# Patient Record
Sex: Female | Born: 1990 | Race: White | Hispanic: No | Marital: Married | Smoking: Never smoker
Health system: Southern US, Community
[De-identification: ages and names within clinical notes are randomized; demographics above are authoritative.]

## PROBLEM LIST (undated history)

## (undated) ENCOUNTER — Inpatient Hospital Stay (HOSPITAL_COMMUNITY): Payer: Self-pay

## (undated) DIAGNOSIS — E039 Hypothyroidism, unspecified: Secondary | ICD-10-CM

## (undated) DIAGNOSIS — K900 Celiac disease: Secondary | ICD-10-CM

## (undated) DIAGNOSIS — E109 Type 1 diabetes mellitus without complications: Secondary | ICD-10-CM

## (undated) DIAGNOSIS — Z8759 Personal history of other complications of pregnancy, childbirth and the puerperium: Secondary | ICD-10-CM

## (undated) DIAGNOSIS — Z9641 Presence of insulin pump (external) (internal): Secondary | ICD-10-CM

## (undated) DIAGNOSIS — Z973 Presence of spectacles and contact lenses: Secondary | ICD-10-CM

## (undated) HISTORY — DX: Celiac disease: K90.0

## (undated) HISTORY — DX: Hypothyroidism, unspecified: E03.9

---
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
## 2009-03-28 ENCOUNTER — Ambulatory Visit (HOSPITAL_COMMUNITY): Admission: RE | Admit: 2009-03-28 | Discharge: 2009-03-28 | Payer: Self-pay | Admitting: Obstetrics and Gynecology

## 2009-05-30 ENCOUNTER — Ambulatory Visit: Payer: Self-pay | Admitting: Obstetrics and Gynecology

## 2009-05-30 ENCOUNTER — Inpatient Hospital Stay (HOSPITAL_COMMUNITY): Admission: AD | Admit: 2009-05-30 | Discharge: 2009-05-30 | Payer: Self-pay | Admitting: Obstetrics and Gynecology

## 2009-06-03 ENCOUNTER — Inpatient Hospital Stay (HOSPITAL_COMMUNITY): Admission: AD | Admit: 2009-06-03 | Discharge: 2009-06-03 | Payer: Self-pay | Admitting: Obstetrics and Gynecology

## 2009-06-03 ENCOUNTER — Ambulatory Visit: Payer: Self-pay | Admitting: Advanced Practice Midwife

## 2009-06-17 ENCOUNTER — Inpatient Hospital Stay (HOSPITAL_COMMUNITY): Admission: AD | Admit: 2009-06-17 | Discharge: 2009-06-20 | Payer: Self-pay | Admitting: Obstetrics and Gynecology

## 2009-06-18 ENCOUNTER — Encounter (INDEPENDENT_AMBULATORY_CARE_PROVIDER_SITE_OTHER): Payer: Self-pay | Admitting: Obstetrics and Gynecology

## 2010-02-27 IMAGING — CT CT HEAD W/O CM
2 series · 16 of 30 positions shown, 20 images · non-contrast
Comparison: None.

CLINICAL DATA: Sudden onset of confusion.  Headache.  Third
trimester pregnancy.

CT HEAD WITHOUT CONTRAST
TECHNIQUE: Contiguous axial images were obtained from the base of
the skull through the vertex without intravenous contrast. The
patient's abdomen was shielded during imaging.

[Series 2: routine head w/o · axial · non-contrast · 0.39mm/px · z∈[+1002,+1127]mm · 13 of 32 slices shown, 17 images]
[im 3/32  brain]
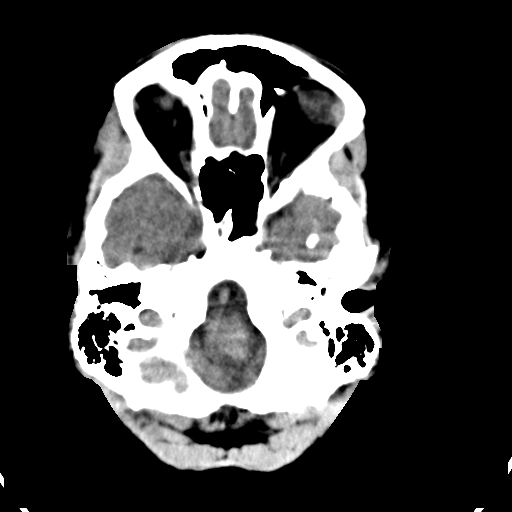
[im 3/32  bone]
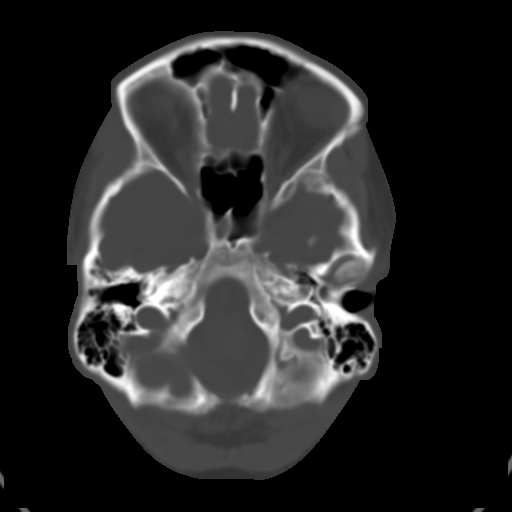
[im 5/32  brain]
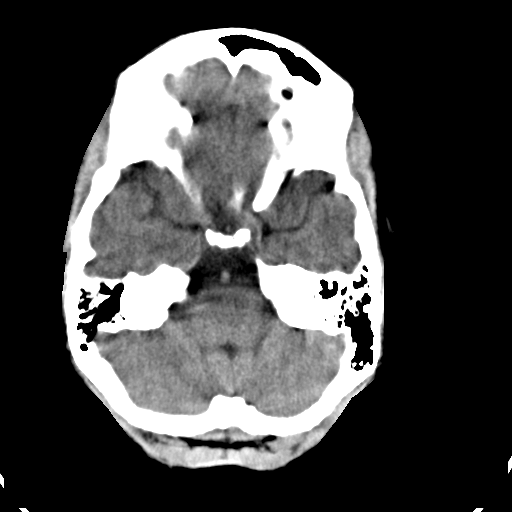
[im 7/32  brain]
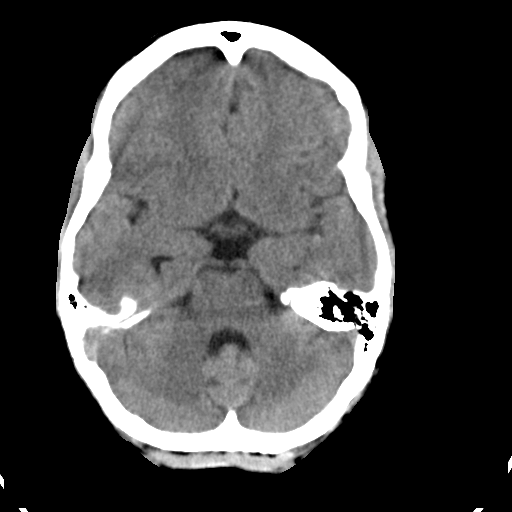
[im 9/32  brain]
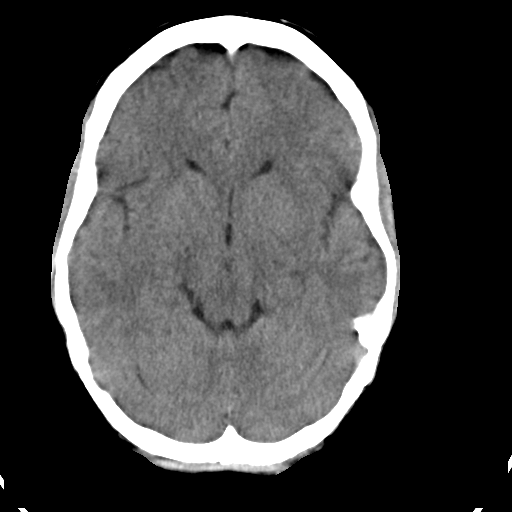
[im 12/32  brain]
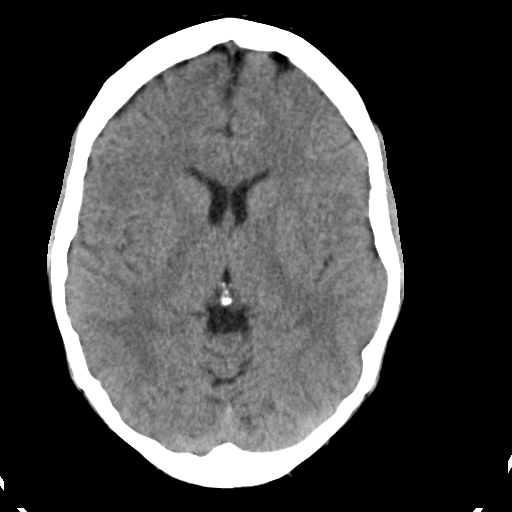
[im 12/32  bone]
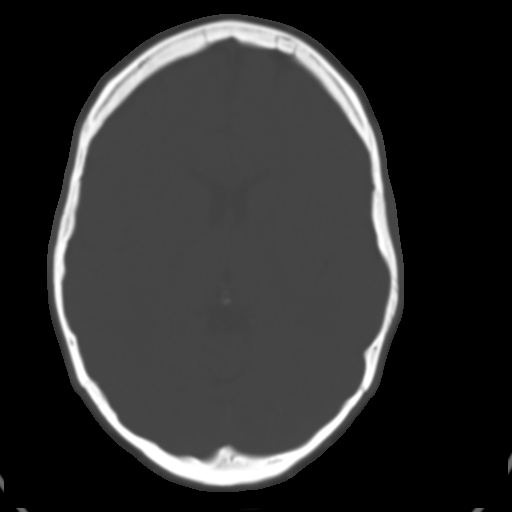
[im 14/32  brain]
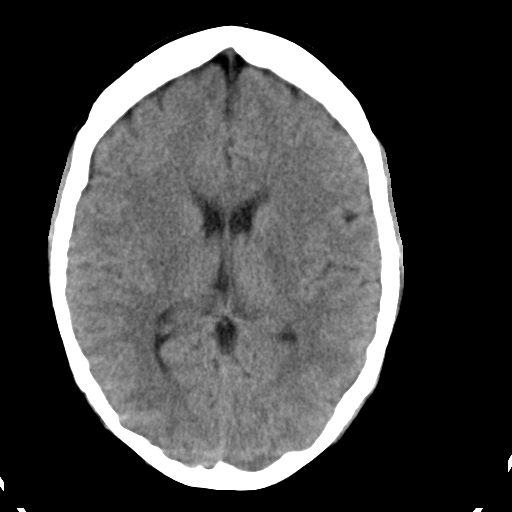
[im 16/32  brain]
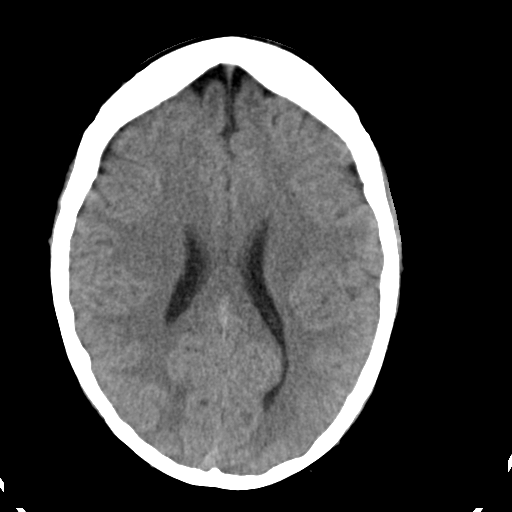
[im 18/32  brain]
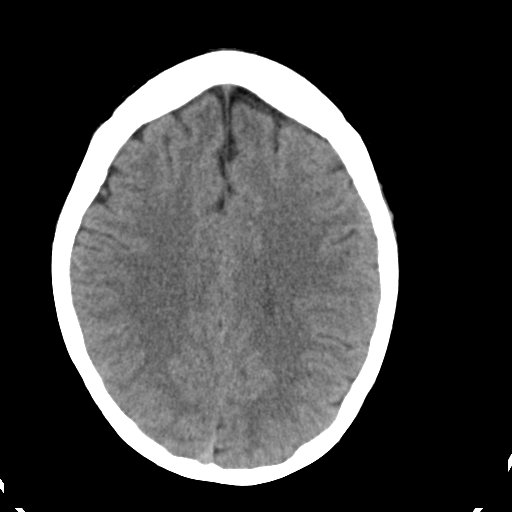
[im 20/32  brain]
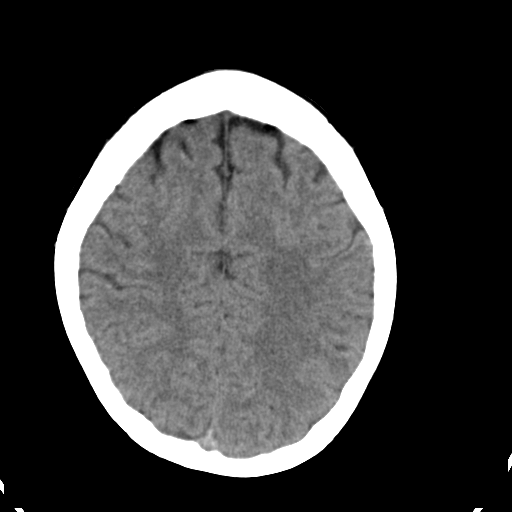
[im 20/32  bone]
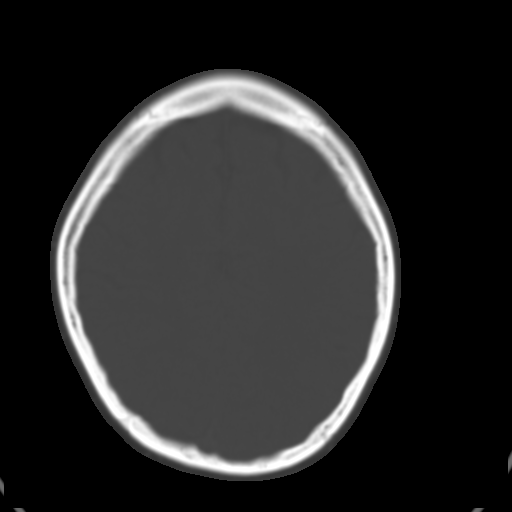
[im 23/32  brain]
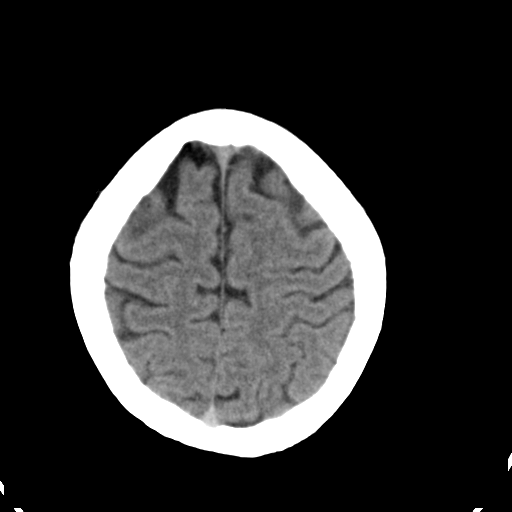
[im 25/32  brain]
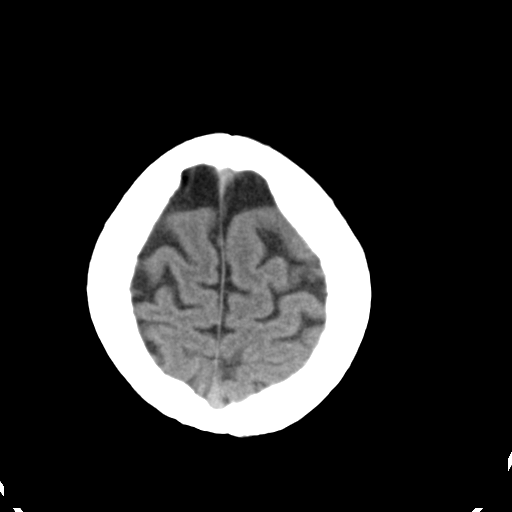
[im 27/32  brain]
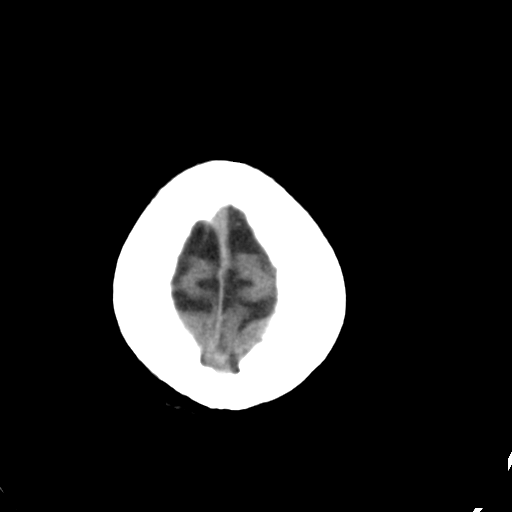
[im 29/32  brain]
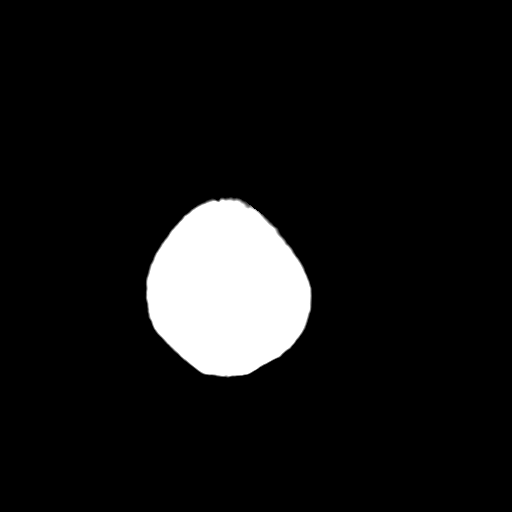
[im 29/32  bone]
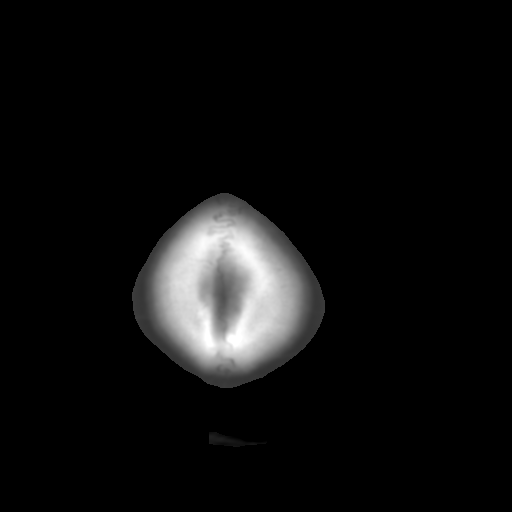

[Series 3: routine head bone · axial · 0.39mm/px · z∈[+1002,+1046]mm · 3 of 32 slices shown]
[im 3/32  bone]
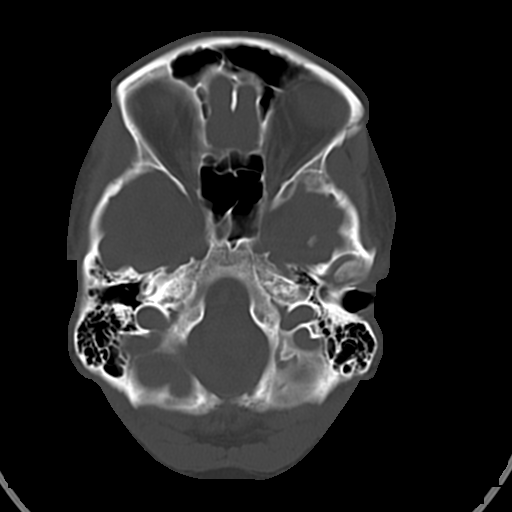
[im 7/32  bone]
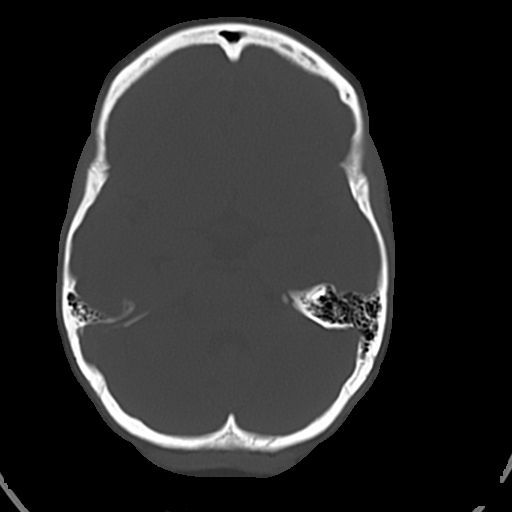
[im 12/32  bone]
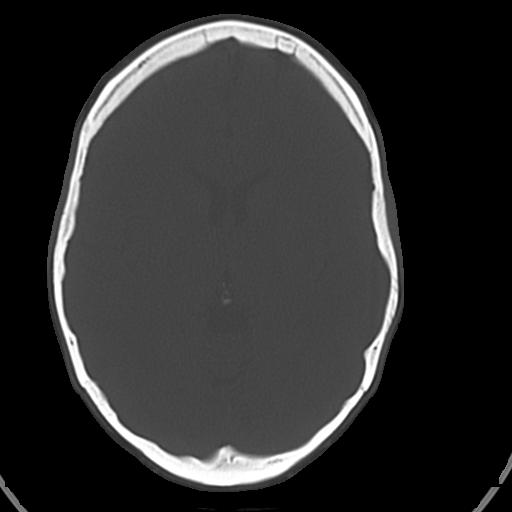

[16 of 30 positions shown; findings below may reference images not displayed]

FINDINGS: The brain stem, cerebellum, cerebral peduncles, thalami,
basal ganglia, basilar cisterns, and ventricular system appear
unremarkable.

No intracranial hemorrhage, mass lesion, or acute infarction is
identified.
IMPRESSION: 1.  No acute intracranial findings are identified.

## 2010-09-21 LAB — CBC
HCT: 26.8 % — ABNORMAL LOW (ref 36.0–46.0)
HCT: 38.8 % (ref 36.0–46.0)
Hemoglobin: 10.9 g/dL — ABNORMAL LOW (ref 12.0–15.0)
Hemoglobin: 8.2 g/dL — ABNORMAL LOW (ref 12.0–15.0)
Hemoglobin: 8.9 g/dL — ABNORMAL LOW (ref 12.0–15.0)
MCHC: 33.2 g/dL (ref 30.0–36.0)
MCHC: 34 g/dL (ref 30.0–36.0)
MCHC: 34.1 g/dL (ref 30.0–36.0)
MCV: 94.7 fL (ref 78.0–100.0)
MCV: 95.6 fL (ref 78.0–100.0)
Platelets: 156 10*3/uL (ref 150–400)
Platelets: 195 10*3/uL (ref 150–400)
RBC: 2.52 MIL/uL — ABNORMAL LOW (ref 3.87–5.11)
RBC: 3.39 MIL/uL — ABNORMAL LOW (ref 3.87–5.11)
RDW: 12.1 % (ref 11.5–15.5)
RDW: 12.1 % (ref 11.5–15.5)
RDW: 12.5 % (ref 11.5–15.5)
WBC: 23.2 10*3/uL — ABNORMAL HIGH (ref 4.0–10.5)

## 2010-09-21 LAB — GLUCOSE, CAPILLARY
Glucose-Capillary: 106 mg/dL — ABNORMAL HIGH (ref 70–99)
Glucose-Capillary: 110 mg/dL — ABNORMAL HIGH (ref 70–99)
Glucose-Capillary: 114 mg/dL — ABNORMAL HIGH (ref 70–99)
Glucose-Capillary: 115 mg/dL — ABNORMAL HIGH (ref 70–99)
Glucose-Capillary: 116 mg/dL — ABNORMAL HIGH (ref 70–99)
Glucose-Capillary: 117 mg/dL — ABNORMAL HIGH (ref 70–99)
Glucose-Capillary: 125 mg/dL — ABNORMAL HIGH (ref 70–99)
Glucose-Capillary: 129 mg/dL — ABNORMAL HIGH (ref 70–99)
Glucose-Capillary: 133 mg/dL — ABNORMAL HIGH (ref 70–99)
Glucose-Capillary: 138 mg/dL — ABNORMAL HIGH (ref 70–99)
Glucose-Capillary: 141 mg/dL — ABNORMAL HIGH (ref 70–99)
Glucose-Capillary: 142 mg/dL — ABNORMAL HIGH (ref 70–99)
Glucose-Capillary: 143 mg/dL — ABNORMAL HIGH (ref 70–99)
Glucose-Capillary: 165 mg/dL — ABNORMAL HIGH (ref 70–99)
Glucose-Capillary: 193 mg/dL — ABNORMAL HIGH (ref 70–99)
Glucose-Capillary: 93 mg/dL (ref 70–99)
Glucose-Capillary: 94 mg/dL (ref 70–99)
Glucose-Capillary: 97 mg/dL (ref 70–99)
Glucose-Capillary: 99 mg/dL (ref 70–99)

## 2010-09-21 LAB — COMPREHENSIVE METABOLIC PANEL
ALT: 16 U/L (ref 0–35)
AST: 22 U/L (ref 0–37)
Albumin: 1.7 g/dL — ABNORMAL LOW (ref 3.5–5.2)
Albumin: 2.6 g/dL — ABNORMAL LOW (ref 3.5–5.2)
Alkaline Phosphatase: 83 U/L (ref 39–117)
BUN: 5 mg/dL — ABNORMAL LOW (ref 6–23)
CO2: 28 mEq/L (ref 19–32)
Calcium: 5.6 mg/dL — CL (ref 8.4–10.5)
Chloride: 104 mEq/L (ref 96–112)
Creatinine, Ser: 0.57 mg/dL (ref 0.4–1.2)
Creatinine, Ser: 0.63 mg/dL (ref 0.4–1.2)
Creatinine, Ser: 0.66 mg/dL (ref 0.4–1.2)
GFR calc Af Amer: >60 mL/min (ref >60)
GFR calc non Af Amer: >60 mL/min (ref >60)
Glucose, Bld: 144 mg/dL — ABNORMAL HIGH (ref 70–99)
Glucose, Bld: 64 mg/dL — ABNORMAL LOW (ref 70–99)
Sodium: 133 mEq/L — ABNORMAL LOW (ref 135–145)
Sodium: 135 mEq/L (ref 135–145)
Total Bilirubin: 0.4 mg/dL (ref 0.3–1.2)
Total Protein: 3.9 g/dL — ABNORMAL LOW (ref 6.0–8.3)
Total Protein: 4 g/dL — ABNORMAL LOW (ref 6.0–8.3)

## 2010-09-21 LAB — URIC ACID
Uric Acid, Serum: 5.7 mg/dL (ref 2.4–7.0)
Uric Acid, Serum: 6.1 mg/dL (ref 2.4–7.0)

## 2010-09-21 LAB — RPR: RPR Ser Ql: NONREACTIVE

## 2010-09-21 LAB — URINALYSIS, DIPSTICK ONLY
Bilirubin Urine: NEGATIVE
Leukocytes, UA: NEGATIVE
Nitrite: NEGATIVE
Specific Gravity, Urine: <1.005 — ABNORMAL LOW (ref 1.005–1.030)
pH: 6.5 (ref 5.0–8.0)

## 2010-09-21 LAB — LACTATE DEHYDROGENASE
LDH: 270 U/L — ABNORMAL HIGH (ref 94–250)
LDH: 277 U/L — ABNORMAL HIGH (ref 94–250)

## 2010-09-22 LAB — GLUCOSE, CAPILLARY: Glucose-Capillary: 155 mg/dL — ABNORMAL HIGH (ref 70–99)

## 2010-09-22 LAB — URINALYSIS, ROUTINE W REFLEX MICROSCOPIC
Nitrite: NEGATIVE
Protein, ur: NEGATIVE mg/dL
Specific Gravity, Urine: <1.005 — ABNORMAL LOW (ref 1.005–1.030)
Urobilinogen, UA: 0.2 mg/dL (ref 0.0–1.0)

## 2010-09-22 LAB — COMPREHENSIVE METABOLIC PANEL
AST: 25 U/L (ref 0–37)
CO2: 22 mEq/L (ref 19–32)
Calcium: 9 mg/dL (ref 8.4–10.5)
Creatinine, Ser: 0.53 mg/dL (ref 0.4–1.2)
GFR calc Af Amer: >60 mL/min (ref >60)
GFR calc non Af Amer: >60 mL/min (ref >60)
Sodium: 134 mEq/L — ABNORMAL LOW (ref 135–145)
Total Protein: 6.3 g/dL (ref 6.0–8.3)

## 2010-09-22 LAB — CBC
MCHC: 33.5 g/dL (ref 30.0–36.0)
MCV: 96.6 fL (ref 78.0–100.0)
Platelets: 234 10*3/uL (ref 150–400)
RBC: 4.33 MIL/uL (ref 3.87–5.11)
RDW: 12.3 % (ref 11.5–15.5)

## 2010-09-22 LAB — URINE MICROSCOPIC-ADD ON

## 2010-12-04 LAB — CBC: Hemoglobin: 10.9 g/dL — AB (ref 12.0–16.0)

## 2010-12-04 LAB — ABO/RH: RH Type: POSITIVE

## 2010-12-04 LAB — CULTURE, BETA STREP (GROUP B ONLY)
Organism ID, Bacteria: POSITIVE
Organism ID, Bacteria: POSITIVE

## 2010-12-04 LAB — HEPATITIS B SURFACE ANTIGEN: Hepatitis B Surface Ag: NEGATIVE

## 2010-12-04 LAB — STREP B DNA PROBE: GBS: POSITIVE

## 2010-12-04 LAB — RPR: RPR: NONREACTIVE

## 2011-01-11 LAB — STREP B DNA PROBE
GBS: POSITIVE
GBS: POSITIVE

## 2011-06-22 NOTE — L&D Delivery Note (Signed)
Delivery Note At  a healthy female was delivered via  OA Presentation APGAR: 9 9 weight . pending Placenta status:normal intact with 3 vessel cord Cord:  with the following complications: none  Anesthesia: Epidural  Episiotomy: none Lacerations: none Suture Repair: not applicable Est. Blood Loss (mL): 300  Mom to postpartum.  Baby to nursery-stable.  Griffin Gerrard L 07/02/2011, 1:27 PM

## 2011-06-28 ENCOUNTER — Encounter (HOSPITAL_COMMUNITY): Payer: Self-pay | Admitting: *Deleted

## 2011-06-28 ENCOUNTER — Telehealth (HOSPITAL_COMMUNITY): Payer: Self-pay | Admitting: *Deleted

## 2011-06-28 NOTE — Telephone Encounter (Signed)
Preadmission screen  

## 2011-07-01 ENCOUNTER — Encounter (HOSPITAL_COMMUNITY): Payer: Medicaid Other

## 2011-07-01 ENCOUNTER — Encounter (HOSPITAL_COMMUNITY): Payer: Self-pay

## 2011-07-01 ENCOUNTER — Inpatient Hospital Stay (HOSPITAL_COMMUNITY)
Admission: RE | Admit: 2011-07-01 | Discharge: 2011-07-04 | DRG: 372 | Disposition: A | Discharge status: Home / Self Care | Payer: BC Managed Care – PPO | Source: Ambulatory Visit | Attending: Obstetrics and Gynecology | Admitting: Obstetrics and Gynecology

## 2011-07-01 DIAGNOSIS — O99892 Other specified diseases and conditions complicating childbirth: Secondary | ICD-10-CM | POA: Diagnosis present

## 2011-07-01 DIAGNOSIS — O2432 Unspecified pre-existing diabetes mellitus in childbirth: Principal | ICD-10-CM | POA: Diagnosis present

## 2011-07-01 DIAGNOSIS — E119 Type 2 diabetes mellitus without complications: Secondary | ICD-10-CM | POA: Diagnosis present

## 2011-07-01 DIAGNOSIS — Z22330 Carrier of Group B streptococcus: Secondary | ICD-10-CM

## 2011-07-01 LAB — CBC
HCT: 33.2 % — ABNORMAL LOW (ref 36.0–46.0)
Hemoglobin: 10.8 g/dL — ABNORMAL LOW (ref 12.0–15.0)
MCV: 83.8 fL (ref 78.0–100.0)
RBC: 3.96 MIL/uL (ref 3.87–5.11)
RDW: 13.7 % (ref 11.5–15.5)
WBC: 9.4 10*3/uL (ref 4.0–10.5)

## 2011-07-01 MED ORDER — FLEET ENEMA 7-19 GM/118ML RE ENEM: 1.0000 | ENEMA | RECTAL | Status: DC | PRN

## 2011-07-01 MED ORDER — ACETAMINOPHEN 325 MG PO TABS: 650.0000 mg | ORAL_TABLET | ORAL | Status: DC | PRN

## 2011-07-01 MED ORDER — CITRIC ACID-SODIUM CITRATE 334-500 MG/5ML PO SOLN: 30.0000 mL | ORAL | Status: DC | PRN

## 2011-07-01 MED ORDER — SODIUM CHLORIDE 0.9 % IV SOLN
2.0000 g | Freq: Four times a day (QID) | INTRAVENOUS | Status: DC
  Administered 2011-07-01 – 2011-07-02 (×3): 2 g via INTRAVENOUS
  Filled 2011-07-01 (×6): qty 2000

## 2011-07-01 MED ORDER — OXYTOCIN BOLUS FROM INFUSION
500.0000 mL | Freq: Once | INTRAVENOUS | Status: AC
  Administered 2011-07-02: 500 mL via INTRAVENOUS
  Filled 2011-07-01: qty 500
  Filled 2011-07-01: qty 1000

## 2011-07-01 MED ORDER — LEVOTHYROXINE SODIUM 75 MCG PO TABS
75.0000 ug | ORAL_TABLET | Freq: Every day | ORAL | Status: DC
  Administered 2011-07-03 – 2011-07-04 (×2): 75 ug via ORAL
  Filled 2011-07-01 (×3): qty 1

## 2011-07-01 MED ORDER — OXYTOCIN 20 UNITS IN LACTATED RINGERS INFUSION - SIMPLE: 125.0000 mL/h | Freq: Once | INTRAVENOUS | Status: DC

## 2011-07-01 MED ORDER — BUTORPHANOL TARTRATE 2 MG/ML IJ SOLN: 1.0000 mg | INTRAMUSCULAR | Status: DC | PRN

## 2011-07-01 MED ORDER — TERBUTALINE SULFATE 1 MG/ML IJ SOLN: 0.2500 mg | Freq: Once | INTRAMUSCULAR | Status: AC | PRN

## 2011-07-01 MED ORDER — IBUPROFEN 600 MG PO TABS: 600.0000 mg | ORAL_TABLET | Freq: Four times a day (QID) | ORAL | Status: DC | PRN

## 2011-07-01 MED ORDER — LIDOCAINE HCL (PF) 1 % IJ SOLN
30.0000 mL | INTRAMUSCULAR | Status: DC | PRN
  Filled 2011-07-01: qty 30

## 2011-07-01 MED ORDER — LACTATED RINGERS IV SOLN
500.0000 mL | INTRAVENOUS | Status: DC | PRN
  Administered 2011-07-02: 500 mL via INTRAVENOUS
  Administered 2011-07-02: 300 mL via INTRAVENOUS

## 2011-07-01 MED ORDER — ONDANSETRON HCL 4 MG/2ML IJ SOLN: 4.0000 mg | Freq: Four times a day (QID) | INTRAMUSCULAR | Status: DC | PRN

## 2011-07-01 MED ORDER — OXYCODONE-ACETAMINOPHEN 5-325 MG PO TABS: 2.0000 | ORAL_TABLET | ORAL | Status: DC | PRN

## 2011-07-01 MED ORDER — INSULIN PUMP
1.0000 | Freq: Three times a day (TID) | SUBCUTANEOUS | Status: DC
  Administered 2011-07-01 – 2011-07-03 (×4): 1 via SUBCUTANEOUS
  Filled 2011-07-01: qty 1

## 2011-07-01 MED ORDER — MISOPROSTOL 25 MCG QUARTER TABLET
25.0000 ug | ORAL_TABLET | ORAL | Status: DC | PRN
  Administered 2011-07-01 – 2011-07-02 (×2): 25 ug via VAGINAL
  Filled 2011-07-01 (×2): qty 0.25

## 2011-07-01 MED ORDER — LACTATED RINGERS IV SOLN
INTRAVENOUS | Status: DC
  Administered 2011-07-01 – 2011-07-02 (×3): via INTRAVENOUS

## 2011-07-01 NOTE — Progress Notes (Signed)
Patient reports basal rates from her insulin pump. Rates and times are as followed: 0000  1.1units 0300 1.35 units 0700 1.3 units 1000 0.9 units 1200 1.0 units 1900 1.0 units

## 2011-07-02 ENCOUNTER — Encounter (HOSPITAL_COMMUNITY): Payer: Self-pay | Admitting: Anesthesiology

## 2011-07-02 ENCOUNTER — Encounter (HOSPITAL_COMMUNITY): Payer: Self-pay

## 2011-07-02 ENCOUNTER — Inpatient Hospital Stay (HOSPITAL_COMMUNITY): Payer: BC Managed Care – PPO | Admitting: Anesthesiology

## 2011-07-02 LAB — STREP B DNA PROBE: GBS: POSITIVE

## 2011-07-02 LAB — GLUCOSE, CAPILLARY
Glucose-Capillary: 105 mg/dL — ABNORMAL HIGH (ref 70–99)
Glucose-Capillary: 68 mg/dL — ABNORMAL LOW (ref 70–99)
Glucose-Capillary: 88 mg/dL (ref 70–99)

## 2011-07-02 MED ORDER — LIDOCAINE HCL 1.5 % IJ SOLN
INTRAMUSCULAR | Status: DC | PRN
  Administered 2011-07-02 (×2): 5 mL via EPIDURAL

## 2011-07-02 MED ORDER — ZOLPIDEM TARTRATE 5 MG PO TABS: 5.0000 mg | ORAL_TABLET | Freq: Every evening | ORAL | Status: DC | PRN

## 2011-07-02 MED ORDER — EPHEDRINE 5 MG/ML INJ: 10.0000 mg | INTRAVENOUS | Status: DC | PRN

## 2011-07-02 MED ORDER — BENZOCAINE-MENTHOL 20-0.5 % EX AERO
1.0000 "application " | INHALATION_SPRAY | CUTANEOUS | Status: DC | PRN
  Administered 2011-07-03: 1 via TOPICAL

## 2011-07-02 MED ORDER — BENZOCAINE-MENTHOL 20-0.5 % EX AERO
INHALATION_SPRAY | CUTANEOUS | Status: AC
  Administered 2011-07-03: 1 via TOPICAL
  Filled 2011-07-02: qty 56

## 2011-07-02 MED ORDER — WITCH HAZEL-GLYCERIN EX PADS: 1.0000 "application " | MEDICATED_PAD | CUTANEOUS | Status: DC | PRN

## 2011-07-02 MED ORDER — OXYCODONE-ACETAMINOPHEN 5-325 MG PO TABS: 1.0000 | ORAL_TABLET | ORAL | Status: DC | PRN

## 2011-07-02 MED ORDER — TETANUS-DIPHTH-ACELL PERTUSSIS 5-2.5-18.5 LF-MCG/0.5 IM SUSP: 0.5000 mL | Freq: Once | INTRAMUSCULAR | Status: DC

## 2011-07-02 MED ORDER — DIPHENHYDRAMINE HCL 25 MG PO CAPS: 25.0000 mg | ORAL_CAPSULE | Freq: Four times a day (QID) | ORAL | Status: DC | PRN

## 2011-07-02 MED ORDER — FENTANYL 2.5 MCG/ML BUPIVACAINE 1/10 % EPIDURAL INFUSION (WH - ANES)
14.0000 mL/h | INTRAMUSCULAR | Status: DC
  Administered 2011-07-02: 14 mL/h via EPIDURAL
  Filled 2011-07-02 (×2): qty 60

## 2011-07-02 MED ORDER — EPHEDRINE 5 MG/ML INJ
10.0000 mg | INTRAVENOUS | Status: DC | PRN
  Filled 2011-07-02: qty 4

## 2011-07-02 MED ORDER — BISACODYL 10 MG RE SUPP: 10.0000 mg | Freq: Every day | RECTAL | Status: DC | PRN

## 2011-07-02 MED ORDER — PHENYLEPHRINE 40 MCG/ML (10ML) SYRINGE FOR IV PUSH (FOR BLOOD PRESSURE SUPPORT)
80.0000 ug | PREFILLED_SYRINGE | INTRAVENOUS | Status: DC | PRN
  Filled 2011-07-02: qty 5

## 2011-07-02 MED ORDER — TERBUTALINE SULFATE 1 MG/ML IJ SOLN: 0.2500 mg | Freq: Once | INTRAMUSCULAR | Status: DC | PRN

## 2011-07-02 MED ORDER — SENNOSIDES-DOCUSATE SODIUM 8.6-50 MG PO TABS
2.0000 | ORAL_TABLET | Freq: Every day | ORAL | Status: DC
  Administered 2011-07-02 – 2011-07-03 (×2): 2 via ORAL

## 2011-07-02 MED ORDER — ONDANSETRON HCL 4 MG PO TABS: 4.0000 mg | ORAL_TABLET | ORAL | Status: DC | PRN

## 2011-07-02 MED ORDER — DIBUCAINE 1 % RE OINT: 1.0000 "application " | TOPICAL_OINTMENT | RECTAL | Status: DC | PRN

## 2011-07-02 MED ORDER — FLEET ENEMA 7-19 GM/118ML RE ENEM: 1.0000 | ENEMA | Freq: Every day | RECTAL | Status: DC | PRN

## 2011-07-02 MED ORDER — OXYTOCIN 20 UNITS IN LACTATED RINGERS INFUSION - SIMPLE
1.0000 m[IU]/min | INTRAVENOUS | Status: DC
  Administered 2011-07-02: 2 m[IU]/min via INTRAVENOUS

## 2011-07-02 MED ORDER — IBUPROFEN 600 MG PO TABS
600.0000 mg | ORAL_TABLET | Freq: Four times a day (QID) | ORAL | Status: DC
  Administered 2011-07-02 – 2011-07-04 (×7): 600 mg via ORAL
  Filled 2011-07-02 (×7): qty 1

## 2011-07-02 MED ORDER — FENTANYL 2.5 MCG/ML BUPIVACAINE 1/10 % EPIDURAL INFUSION (WH - ANES)
INTRAMUSCULAR | Status: DC | PRN
  Administered 2011-07-02: 14 mL/h via EPIDURAL

## 2011-07-02 MED ORDER — PHENYLEPHRINE 40 MCG/ML (10ML) SYRINGE FOR IV PUSH (FOR BLOOD PRESSURE SUPPORT): 80.0000 ug | PREFILLED_SYRINGE | INTRAVENOUS | Status: DC | PRN

## 2011-07-02 MED ORDER — PRENATAL MULTIVITAMIN CH
1.0000 | ORAL_TABLET | Freq: Every day | ORAL | Status: DC
  Administered 2011-07-02 – 2011-07-03 (×2): 1 via ORAL
  Filled 2011-07-02 (×2): qty 1

## 2011-07-02 MED ORDER — DIPHENHYDRAMINE HCL 50 MG/ML IJ SOLN: 12.5000 mg | INTRAMUSCULAR | Status: DC | PRN

## 2011-07-02 MED ORDER — MEDROXYPROGESTERONE ACETATE 150 MG/ML IM SUSP: 150.0000 mg | INTRAMUSCULAR | Status: DC | PRN

## 2011-07-02 MED ORDER — SIMETHICONE 80 MG PO CHEW: 80.0000 mg | CHEWABLE_TABLET | ORAL | Status: DC | PRN

## 2011-07-02 MED ORDER — MEASLES, MUMPS & RUBELLA VAC ~~LOC~~ INJ
0.5000 mL | INJECTION | Freq: Once | SUBCUTANEOUS | Status: AC
  Administered 2011-07-04: 0.5 mL via SUBCUTANEOUS
  Filled 2011-07-02: qty 0.5

## 2011-07-02 MED ORDER — LACTATED RINGERS IV SOLN: 500.0000 mL | Freq: Once | INTRAVENOUS | Status: DC

## 2011-07-02 MED ORDER — ONDANSETRON HCL 4 MG/2ML IJ SOLN: 4.0000 mg | INTRAMUSCULAR | Status: DC | PRN

## 2011-07-02 MED ORDER — LANOLIN HYDROUS EX OINT: TOPICAL_OINTMENT | CUTANEOUS | Status: DC | PRN

## 2011-07-02 NOTE — Anesthesia Preprocedure Evaluation (Signed)
Anesthesia Evaluation  Patient identified by MRN, date of birth, ID band Patient awake    Reviewed: Allergy & Precautions, H&P , Patient's Chart, lab work & pertinent test results  Airway Mallampati: I TM Distance: >3 FB Neck ROM: full    Dental No notable dental hx.    Pulmonary neg pulmonary ROS,    Pulmonary exam normal       Cardiovascular hypertension,     Neuro/Psych Negative Neurological ROS  Negative Psych ROS   GI/Hepatic negative GI ROS, Neg liver ROS,   Endo/Other  Diabetes mellitus-Hypothyroidism   Renal/GU negative Renal ROS  Genitourinary negative   Musculoskeletal negative musculoskeletal ROS (+)   Abdominal Normal abdominal exam  (+)   Peds negative pediatric ROS (+)  Hematology negative hematology ROS (+)   Anesthesia Other Findings   Reproductive/Obstetrics (+) Pregnancy                           Anesthesia Physical Anesthesia Plan  ASA: II  Anesthesia Plan: Epidural   Post-op Pain Management:    Induction:   Airway Management Planned:   Additional Equipment:   Intra-op Plan:   Post-operative Plan:   Informed Consent: I have reviewed the patients History and Physical, chart, labs and discussed the procedure including the risks, benefits and alternatives for the proposed anesthesia with the patient or authorized representative who has indicated his/her understanding and acceptance.     Plan Discussed with:   Anesthesia Plan Comments:         Anesthesia Quick Evaluation

## 2011-07-02 NOTE — H&P (Signed)
21 year old Gravida 2 Para 0101 at 58 w 3 days presents for induction secondary to IDDM with mature amnio. She has had one NSVD. She has IDDM- managed with an insulin pump and has been a little more labile lately. She sees endocrinologist in [place]. Her other child had trucus arteriosis. She had normal fetal ECHO this pregnancy. GBBS is +  Afebrile Vital signs stable Fetal heart rate is reactive Toco UCS hard to trace General alert and oriented LUNG CTAB Car RRR Abdomen is soft and non tender Pelvic cervix is 80%/3 cm / -2 AROM Clear fluid IUPC placed  Last CBS was 107  IMPRESSION: IDDM Induction GBBS+  PLAN: Pitocin Epidural Ampicillin Continue Insulin pump

## 2011-07-02 NOTE — Anesthesia Postprocedure Evaluation (Signed)
  Anesthesia Post-op Note  Patient: Stacey Bell  Procedure(s) Performed: * No procedures listed *  Patient Location: PACU and Mother/Baby  Anesthesia Type: Epidural  Level of Consciousness: awake, alert  and patient cooperative  Airway and Oxygen Therapy: Patient Spontanous Breathing  Post-op Pain: none  Post-op Assessment: Post-op Vital signs reviewed Anesthesia Post Note  Patient: Stacey Bell  Procedure(s) Performed: * No procedures listed *  Anesthesia type: Epidural  Patient location: Mother/Baby  Post pain: Pain level controlled  Post assessment: Post-op Vital signs reviewed  Last Vitals:  Filed Vitals:   07/02/11 1545  BP: 123/85  Pulse: 80  Temp:   Resp: 18    Post vital signs: Reviewed  Level of consciousness: awake  Complications: No apparent anesthesia complications  Post-op Vital Signs: Reviewed and stable  Complications: No apparent anesthesia complications

## 2011-07-02 NOTE — Anesthesia Procedure Notes (Signed)
Epidural Patient location during procedure: OB Start time: 07/02/2011 8:39 AM End time: 07/02/2011 8:43 AM Reason for block: procedure for pain  Staffing Anesthesiologist: Sandrea Hughs Performed by: anesthesiologist   Preanesthetic Checklist Completed: patient identified, site marked, surgical consent, pre-op evaluation, timeout performed, IV checked, risks and benefits discussed and monitors and equipment checked  Epidural Patient position: sitting Prep: site prepped and draped and DuraPrep Patient monitoring: continuous pulse ox and blood pressure Approach: midline Injection technique: LOR air  Needle:  Needle type: Tuohy  Needle gauge: 17 G Needle length: 9 cm Needle insertion depth: 5 cm cm Catheter type: closed end flexible Catheter size: 19 Gauge Catheter at skin depth: 10 cm Test dose: negative and 1.5% lidocaine  Assessment Sensory level: T8 Events: blood not aspirated, injection not painful, no injection resistance, negative IV test and no paresthesia

## 2011-07-02 NOTE — Progress Notes (Signed)
Dr Vincente Poli @ [facility], discussed with her about possibility of pt going on Glucostabilizer. OK to continue with pt management of insulin pump. Will check CBGs q 2 hrs or if symptomatic.

## 2011-07-02 NOTE — Anesthesia Postprocedure Evaluation (Signed)
Anesthesia Post Note  Patient: Stacey Bell  Procedure(s) Performed: * No procedures listed *  Anesthesia type: Epidural  Patient location: Mother/Baby  Post pain: Pain level controlled  Post assessment: Post-op Vital signs reviewed  Last Vitals:  Filed Vitals:   07/02/11 1402  BP: 132/85  Pulse: 89  Temp:   Resp: 20    Post vital signs: Reviewed  Level of consciousness: awake  Complications: No apparent anesthesia complications

## 2011-07-03 LAB — CBC
Hemoglobin: 10.8 g/dL — ABNORMAL LOW (ref 12.0–15.0)
MCH: 26.8 pg (ref 26.0–34.0)
RBC: 4.03 MIL/uL (ref 3.87–5.11)
WBC: 12.7 10*3/uL — ABNORMAL HIGH (ref 4.0–10.5)

## 2011-07-03 NOTE — Progress Notes (Signed)
Post Partum Day 1 Subjective: no complaints, up ad lib, voiding and tolerating PO. She is on insulin pump, monitoring her CBGs and they have all been normal.   Objective: Blood pressure 120/83, pulse 68, temperature 97.8 F (36.6 C), temperature source Oral, resp. rate 18, height 5\' 6"  (1.676 m), weight 70.308 kg (155 lb), SpO2 98.00%, unknown if currently breastfeeding.  Physical Exam:  General: alert, cooperative and appears stated age Lochia: appropriate Uterine Fundus: firm Incision: not applicable DVT Evaluation: No evidence of DVT seen on physical exam. Negative Homan's sign.   Basename 07/03/11 0530 07/01/11 2010  HGB 10.8* 10.8*  HCT 33.8* 33.2*    Assessment/Plan: Plan for discharge tomorrow IDDM - Continue insulin pump - patient is managing that well.   LOS: 2 days   Stacey Bell L 07/03/2011, 8:24 AM

## 2011-07-04 NOTE — Progress Notes (Signed)
Post Partum Day 2 Subjective: no complaints She adjusted her insulin pump - lowered the basal rate but CBGS are fine.  Objective: Blood pressure 124/80, pulse 71, temperature 98.1 F (36.7 C), temperature source Oral, resp. rate 18, height 5\' 6"  (1.676 m), weight 70.308 kg (155 lb), SpO2 98.00%, unknown if currently breastfeeding.  Physical Exam:  General: alert, cooperative and appears stated age Lochia: appropriate Uterine Fundus: firm Incision: healing well, no significant drainage, no dehiscence DVT Evaluation: No evidence of DVT seen on physical exam. Negative Homan's sign.   Basename 07/03/11 0530 07/01/11 2010  HGB 10.8* 10.8*  HCT 33.8* 33.2*    Assessment/Plan: Discharge home Insulin pump is managed by the patient Discharge instructions  No medications   LOS: 3 days   Chera Slivka L 07/04/2011, 7:46 AM

## 2011-07-04 NOTE — Discharge Summary (Signed)
Obstetric Discharge Summary Reason for Admission: induction of labor Prenatal Procedures: none Intrapartum Procedures: spontaneous vaginal delivery Postpartum Procedures: none Complications-Operative and Postpartum: none Hemoglobin  Date Value Range Status  07/03/2011 10.8* 12.0-15.0 (g/dL) Final     HCT  Date Value Range Status  07/03/2011 33.8* 36.0-46.0 (%) Final    Discharge Diagnoses: Term Pregnancy-delivered  Discharge Information: Date: 07/04/2011 Activity: pelvic rest Diet: routine Medications: None Condition: improved Instructions: refer to practice specific booklet Discharge to: home   Newborn Data: Live born female  Birth Weight: 8 lb 10.1 oz (3915 g) APGAR: 8, 9  Home with mother.  Marchia Diguglielmo L 07/04/2011, 7:47 AM

## 2011-07-05 NOTE — Progress Notes (Signed)
Post discharge chart review completed.  

## 2011-07-27 ENCOUNTER — Inpatient Hospital Stay (HOSPITAL_COMMUNITY): Admission: AD | Admit: 2011-07-27 | Payer: Self-pay | Source: Ambulatory Visit | Admitting: Obstetrics and Gynecology

## 2011-08-23 ENCOUNTER — Other Ambulatory Visit: Payer: Self-pay | Admitting: Obstetrics and Gynecology

## 2012-10-12 ENCOUNTER — Other Ambulatory Visit: Payer: Self-pay | Admitting: Obstetrics and Gynecology

## 2013-02-26 LAB — OB RESULTS CONSOLE HIV ANTIBODY (ROUTINE TESTING): HIV: NONREACTIVE

## 2013-02-26 LAB — OB RESULTS CONSOLE GC/CHLAMYDIA
Chlamydia: NEGATIVE
Gonorrhea: NEGATIVE

## 2013-02-26 LAB — OB RESULTS CONSOLE RPR: RPR: NONREACTIVE

## 2013-06-21 HISTORY — PX: WISDOM TOOTH EXTRACTION: SHX21

## 2013-06-21 NOTE — L&D Delivery Note (Signed)
Delivery Note At 2:12 PM a viable female was delivered via  OA Presentation Apgars 9 and 9 Placenta status spontaneously with 3 vessel cord:loose nuchal cord , .  Cord:  with the following complications: none.  Cord pH: not obtained  Anesthesia:  epidural Episiotomy: none Lacerations: none Suture Repair: not applicable Est. Blood Loss (mL): 300  Mom to postpartum.  Baby to Couplet care / Skin to Skin.  Burhanuddin Kohlmann L 09/07/2013, 2:23 PM

## 2013-08-30 ENCOUNTER — Inpatient Hospital Stay (HOSPITAL_COMMUNITY): Admission: AD | Admit: 2013-08-30 | Payer: Self-pay | Source: Ambulatory Visit | Admitting: Obstetrics and Gynecology

## 2013-08-31 LAB — OB RESULTS CONSOLE GBS: GBS: POSITIVE

## 2013-09-06 ENCOUNTER — Inpatient Hospital Stay (HOSPITAL_COMMUNITY)
Admission: RE | Admit: 2013-09-06 | Discharge: 2013-09-09 | DRG: 774 | Disposition: A | Discharge status: Home / Self Care | Payer: BC Managed Care – PPO | Source: Ambulatory Visit | Attending: Obstetrics and Gynecology | Admitting: Obstetrics and Gynecology

## 2013-09-06 ENCOUNTER — Encounter (HOSPITAL_COMMUNITY): Payer: Self-pay

## 2013-09-06 VITALS — BP 113/77 | HR 62 | Temp 98.4°F | Resp 18 | Ht 65.0 in | Wt 160.0 lb

## 2013-09-06 DIAGNOSIS — E079 Disorder of thyroid, unspecified: Secondary | ICD-10-CM | POA: Diagnosis present

## 2013-09-06 DIAGNOSIS — O2432 Unspecified pre-existing diabetes mellitus in childbirth: Principal | ICD-10-CM | POA: Diagnosis present

## 2013-09-06 DIAGNOSIS — O99284 Endocrine, nutritional and metabolic diseases complicating childbirth: Secondary | ICD-10-CM

## 2013-09-06 DIAGNOSIS — O3660X0 Maternal care for excessive fetal growth, unspecified trimester, not applicable or unspecified: Secondary | ICD-10-CM | POA: Diagnosis present

## 2013-09-06 DIAGNOSIS — E119 Type 2 diabetes mellitus without complications: Secondary | ICD-10-CM | POA: Diagnosis present

## 2013-09-06 DIAGNOSIS — Z22330 Carrier of Group B streptococcus: Secondary | ICD-10-CM

## 2013-09-06 DIAGNOSIS — O9989 Other specified diseases and conditions complicating pregnancy, childbirth and the puerperium: Secondary | ICD-10-CM

## 2013-09-06 DIAGNOSIS — E039 Hypothyroidism, unspecified: Secondary | ICD-10-CM | POA: Diagnosis present

## 2013-09-06 DIAGNOSIS — Z794 Long term (current) use of insulin: Secondary | ICD-10-CM

## 2013-09-06 DIAGNOSIS — O99892 Other specified diseases and conditions complicating childbirth: Secondary | ICD-10-CM | POA: Diagnosis present

## 2013-09-06 LAB — CBC
HEMATOCRIT: 34.9 % — AB (ref 36.0–46.0)
Hemoglobin: 11.8 g/dL — ABNORMAL LOW (ref 12.0–15.0)
MCH: 30.7 pg (ref 26.0–34.0)
MCHC: 33.8 g/dL (ref 30.0–36.0)
MCV: 90.9 fL (ref 78.0–100.0)
PLATELETS: 245 10*3/uL (ref 150–400)
RBC: 3.84 MIL/uL — ABNORMAL LOW (ref 3.87–5.11)
RDW: 13 % (ref 11.5–15.5)
WBC: 10 10*3/uL (ref 4.0–10.5)

## 2013-09-06 LAB — COMPREHENSIVE METABOLIC PANEL
ALBUMIN: 2.5 g/dL — AB (ref 3.5–5.2)
ALK PHOS: 170 U/L — AB (ref 39–117)
ALT: 9 U/L (ref 0–35)
AST: 16 U/L (ref 0–37)
BUN: 10 mg/dL (ref 6–23)
CHLORIDE: 100 meq/L (ref 96–112)
CO2: 21 mEq/L (ref 19–32)
Calcium: 8.6 mg/dL (ref 8.4–10.5)
Creatinine, Ser: 0.52 mg/dL (ref 0.50–1.10)
GFR calc Af Amer: >90 mL/min (ref >90)
GFR calc non Af Amer: >90 mL/min (ref >90)
GLUCOSE: 78 mg/dL (ref 70–99)
Potassium: 4.1 mEq/L (ref 3.7–5.3)
SODIUM: 134 meq/L — AB (ref 137–147)
TOTAL PROTEIN: 5.7 g/dL — AB (ref 6.0–8.3)
Total Bilirubin: 0.3 mg/dL (ref 0.3–1.2)

## 2013-09-06 MED ORDER — OXYTOCIN BOLUS FROM INFUSION: 500.0000 mL | INTRAVENOUS | Status: DC

## 2013-09-06 MED ORDER — OXYTOCIN 40 UNITS IN LACTATED RINGERS INFUSION - SIMPLE MED
1.0000 m[IU]/min | INTRAVENOUS | Status: DC
  Administered 2013-09-06: 1 m[IU]/min via INTRAVENOUS
  Administered 2013-09-07: 10 m[IU]/min via INTRAVENOUS
  Administered 2013-09-07: 11 m[IU]/min via INTRAVENOUS
  Administered 2013-09-07: 4 m[IU]/min via INTRAVENOUS
  Filled 2013-09-06: qty 1000

## 2013-09-06 MED ORDER — TERBUTALINE SULFATE 1 MG/ML IJ SOLN: 0.2500 mg | Freq: Once | INTRAMUSCULAR | Status: AC | PRN

## 2013-09-06 MED ORDER — ACETAMINOPHEN 325 MG PO TABS: 650.0000 mg | ORAL_TABLET | ORAL | Status: DC | PRN

## 2013-09-06 MED ORDER — CITRIC ACID-SODIUM CITRATE 334-500 MG/5ML PO SOLN: 30.0000 mL | ORAL | Status: DC | PRN

## 2013-09-06 MED ORDER — PENICILLIN G POTASSIUM 5000000 UNITS IJ SOLR
5.0000 10*6.[IU] | Freq: Once | INTRAVENOUS | Status: AC
  Administered 2013-09-06: 5 10*6.[IU] via INTRAVENOUS
  Filled 2013-09-06: qty 5

## 2013-09-06 MED ORDER — OXYTOCIN 40 UNITS IN LACTATED RINGERS INFUSION - SIMPLE MED: 62.5000 mL/h | INTRAVENOUS | Status: DC

## 2013-09-06 MED ORDER — LIDOCAINE HCL (PF) 1 % IJ SOLN
30.0000 mL | INTRAMUSCULAR | Status: DC | PRN
  Filled 2013-09-06: qty 30

## 2013-09-06 MED ORDER — IBUPROFEN 600 MG PO TABS: 600.0000 mg | ORAL_TABLET | Freq: Four times a day (QID) | ORAL | Status: DC | PRN

## 2013-09-06 MED ORDER — DEXTROSE 5 % IV SOLN
2.5000 10*6.[IU] | INTRAVENOUS | Status: DC
  Administered 2013-09-07 (×3): 2.5 10*6.[IU] via INTRAVENOUS
  Filled 2013-09-06 (×7): qty 2.5

## 2013-09-06 MED ORDER — DEXTROSE 5 % IV SOLN: 2.0000 10*6.[IU] | Freq: Once | INTRAVENOUS | Status: DC

## 2013-09-06 MED ORDER — LACTATED RINGERS IV SOLN
INTRAVENOUS | Status: DC
  Administered 2013-09-06 – 2013-09-07 (×2): via INTRAVENOUS

## 2013-09-06 MED ORDER — LACTATED RINGERS IV SOLN: 500.0000 mL | INTRAVENOUS | Status: DC | PRN

## 2013-09-06 MED ORDER — VANCOMYCIN HCL IN DEXTROSE 1-5 GM/200ML-% IV SOLN: 1000.0000 mg | Freq: Two times a day (BID) | INTRAVENOUS | Status: DC

## 2013-09-06 MED ORDER — ONDANSETRON HCL 4 MG/2ML IJ SOLN: 4.0000 mg | Freq: Four times a day (QID) | INTRAMUSCULAR | Status: DC | PRN

## 2013-09-06 MED ORDER — OXYCODONE-ACETAMINOPHEN 5-325 MG PO TABS: 1.0000 | ORAL_TABLET | ORAL | Status: DC | PRN

## 2013-09-06 NOTE — H&P (Signed)
Hilbert OdorShawna Biggers is a 23 y.o. female presenting for IOL.  Pregnancy complicated by IDDM - insulin pump & hypothyroidism.  US suspects LGA.  Pt reports occasional contractions.  No lof or vb.    History OB History   Grav Para Term Preterm Abortions TAB SAB Ect Mult Living   4 2 0 2 1  1   2      Past Medical History  Diagnosis Date  . Celiac disease   . Pregnancy induced hypertension   . Diabetes mellitus     insulin  . Hypothyroidism    No past surgical history on file. Family History: family history includes Birth defects in her daughter; Cancer in her paternal uncle; Heart disease in her paternal grandfather; Hypertension in her maternal grandmother. Social History:  reports that she has never smoked. She has never used smokeless tobacco. She reports that she does not drink alcohol or use illicit drugs.   Prenatal Transfer Tool  Maternal Diabetes: Yes:  Diabetes Type:  Insulin/Medication controlled Genetic Screening: Normal Maternal Ultrasounds/Referrals: Normal Fetal Ultrasounds or other Referrals:  Fetal echo Maternal Substance Abuse:  No Significant Maternal Medications:  Meds include: Syntroid Other: Insulin Significant Maternal Lab Results:  None Other Comments:  h/o infant with cardiac defect  ROS  Dilation: 2.5 Effacement (%): 50 Exam by:: Dr. Renaldo FiddlerAdkins Blood pressure 112/69, pulse 81, temperature 98.2 F (36.8 C), temperature source Oral, resp. rate 20, height 5\' 5"  (1.651 m), weight 72.576 kg (160 lb), unknown if currently breastfeeding. Exam Physical Exam  Gen - NAD Abd - gravid, NT  EFW 8 1/2# Ext - NT Cvx 2.5/50/-3  Prenatal labs: ABO, Rh:   Antibody:   Rubella:   RPR: Nonreactive (09/08 0000)  HBsAg:    HIV: Non-reactive (09/08 0000)  GBS: Positive (03/13 0000)   Assessment/Plan: Admit for IOL Pitocin augmentation PCN for GBS prophylaxis Check glucose, pt to continue and adjust insulin pump prn  Cloria Ciresi 09/06/2013, 10:12 PM

## 2013-09-07 ENCOUNTER — Encounter (HOSPITAL_COMMUNITY): Payer: Self-pay

## 2013-09-07 ENCOUNTER — Inpatient Hospital Stay (HOSPITAL_COMMUNITY): Payer: BC Managed Care – PPO | Admitting: Anesthesiology

## 2013-09-07 ENCOUNTER — Encounter (HOSPITAL_COMMUNITY): Payer: BC Managed Care – PPO | Admitting: Anesthesiology

## 2013-09-07 LAB — RPR: RPR Ser Ql: NONREACTIVE

## 2013-09-07 LAB — GLUCOSE, CAPILLARY
GLUCOSE-CAPILLARY: 95 mg/dL (ref 70–99)
Glucose-Capillary: 93 mg/dL (ref 70–99)
Glucose-Capillary: 91 mg/dL (ref 70–99)
Glucose-Capillary: 162 mg/dL — ABNORMAL HIGH (ref 70–99)

## 2013-09-07 MED ORDER — FLEET ENEMA 7-19 GM/118ML RE ENEM: 1.0000 | ENEMA | Freq: Every day | RECTAL | Status: DC | PRN

## 2013-09-07 MED ORDER — LACTATED RINGERS IV SOLN
500.0000 mL | Freq: Once | INTRAVENOUS | Status: AC
  Administered 2013-09-07: 08:00:00 via INTRAVENOUS

## 2013-09-07 MED ORDER — SENNOSIDES-DOCUSATE SODIUM 8.6-50 MG PO TABS
2.0000 | ORAL_TABLET | ORAL | Status: DC
  Administered 2013-09-08 (×2): 2 via ORAL
  Filled 2013-09-07 (×2): qty 2

## 2013-09-07 MED ORDER — FENTANYL 2.5 MCG/ML BUPIVACAINE 1/10 % EPIDURAL INFUSION (WH - ANES)
INTRAMUSCULAR | Status: DC | PRN
  Administered 2013-09-07: 14 mL/h via EPIDURAL

## 2013-09-07 MED ORDER — DIPHENHYDRAMINE HCL 50 MG/ML IJ SOLN: 12.5000 mg | INTRAMUSCULAR | Status: DC | PRN

## 2013-09-07 MED ORDER — SIMETHICONE 80 MG PO CHEW: 80.0000 mg | CHEWABLE_TABLET | ORAL | Status: DC | PRN

## 2013-09-07 MED ORDER — ZOLPIDEM TARTRATE 5 MG PO TABS: 5.0000 mg | ORAL_TABLET | Freq: Every evening | ORAL | Status: DC | PRN

## 2013-09-07 MED ORDER — DIPHENHYDRAMINE HCL 25 MG PO CAPS: 25.0000 mg | ORAL_CAPSULE | Freq: Four times a day (QID) | ORAL | Status: DC | PRN

## 2013-09-07 MED ORDER — TETANUS-DIPHTH-ACELL PERTUSSIS 5-2.5-18.5 LF-MCG/0.5 IM SUSP: 0.5000 mL | Freq: Once | INTRAMUSCULAR | Status: DC

## 2013-09-07 MED ORDER — WITCH HAZEL-GLYCERIN EX PADS
1.0000 "application " | MEDICATED_PAD | CUTANEOUS | Status: DC | PRN
  Administered 2013-09-08: 1 via TOPICAL

## 2013-09-07 MED ORDER — LANOLIN HYDROUS EX OINT: TOPICAL_OINTMENT | CUTANEOUS | Status: DC | PRN

## 2013-09-07 MED ORDER — MEDROXYPROGESTERONE ACETATE 150 MG/ML IM SUSP: 150.0000 mg | INTRAMUSCULAR | Status: DC | PRN

## 2013-09-07 MED ORDER — PHENYLEPHRINE 40 MCG/ML (10ML) SYRINGE FOR IV PUSH (FOR BLOOD PRESSURE SUPPORT): 80.0000 ug | PREFILLED_SYRINGE | INTRAVENOUS | Status: DC | PRN

## 2013-09-07 MED ORDER — BENZOCAINE-MENTHOL 20-0.5 % EX AERO
1.0000 "application " | INHALATION_SPRAY | CUTANEOUS | Status: DC | PRN
  Administered 2013-09-08: 1 via TOPICAL
  Filled 2013-09-07: qty 56

## 2013-09-07 MED ORDER — FENTANYL 2.5 MCG/ML BUPIVACAINE 1/10 % EPIDURAL INFUSION (WH - ANES): 14.0000 mL/h | INTRAMUSCULAR | Status: DC | PRN

## 2013-09-07 MED ORDER — OXYCODONE-ACETAMINOPHEN 5-325 MG PO TABS
1.0000 | ORAL_TABLET | ORAL | Status: DC | PRN
  Administered 2013-09-08 – 2013-09-09 (×5): 1 via ORAL
  Filled 2013-09-07 (×5): qty 1

## 2013-09-07 MED ORDER — FENTANYL 2.5 MCG/ML BUPIVACAINE 1/10 % EPIDURAL INFUSION (WH - ANES)
INTRAMUSCULAR | Status: AC
  Filled 2013-09-07: qty 125

## 2013-09-07 MED ORDER — DIBUCAINE 1 % RE OINT: 1.0000 "application " | TOPICAL_OINTMENT | RECTAL | Status: DC | PRN

## 2013-09-07 MED ORDER — ONDANSETRON HCL 4 MG PO TABS: 4.0000 mg | ORAL_TABLET | ORAL | Status: DC | PRN

## 2013-09-07 MED ORDER — PRENATAL MULTIVITAMIN CH
1.0000 | ORAL_TABLET | Freq: Every day | ORAL | Status: DC
  Administered 2013-09-08 – 2013-09-09 (×2): 1 via ORAL
  Filled 2013-09-07 (×2): qty 1

## 2013-09-07 MED ORDER — PHENYLEPHRINE 40 MCG/ML (10ML) SYRINGE FOR IV PUSH (FOR BLOOD PRESSURE SUPPORT)
PREFILLED_SYRINGE | INTRAVENOUS | Status: AC
  Filled 2013-09-07: qty 10

## 2013-09-07 MED ORDER — EPHEDRINE 5 MG/ML INJ: 10.0000 mg | INTRAVENOUS | Status: DC | PRN

## 2013-09-07 MED ORDER — LIDOCAINE HCL (PF) 1 % IJ SOLN
INTRAMUSCULAR | Status: DC | PRN
  Administered 2013-09-07 (×2): 8 mL

## 2013-09-07 MED ORDER — ONDANSETRON HCL 4 MG/2ML IJ SOLN: 4.0000 mg | INTRAMUSCULAR | Status: DC | PRN

## 2013-09-07 MED ORDER — LEVOTHYROXINE SODIUM 112 MCG PO TABS
112.0000 ug | ORAL_TABLET | Freq: Every day | ORAL | Status: DC
  Administered 2013-09-08 – 2013-09-09 (×2): 112 ug via ORAL
  Filled 2013-09-07 (×3): qty 1

## 2013-09-07 MED ORDER — INSULIN PUMP
Freq: Three times a day (TID) | SUBCUTANEOUS | Status: DC
  Administered 2013-09-07: 9.2 via SUBCUTANEOUS
  Administered 2013-09-08: 8.5 via SUBCUTANEOUS
  Administered 2013-09-08: 7.4 via SUBCUTANEOUS
  Administered 2013-09-08: 7.5 via SUBCUTANEOUS
  Administered 2013-09-09: 6.4 via SUBCUTANEOUS
  Filled 2013-09-07: qty 1

## 2013-09-07 MED ORDER — BISACODYL 10 MG RE SUPP: 10.0000 mg | Freq: Every day | RECTAL | Status: DC | PRN

## 2013-09-07 MED ORDER — EPHEDRINE 5 MG/ML INJ
INTRAVENOUS | Status: AC
  Filled 2013-09-07: qty 4

## 2013-09-07 MED ORDER — MEASLES, MUMPS & RUBELLA VAC ~~LOC~~ INJ
0.5000 mL | INJECTION | Freq: Once | SUBCUTANEOUS | Status: AC
  Administered 2013-09-09: 0.5 mL via SUBCUTANEOUS
  Filled 2013-09-07: qty 0.5

## 2013-09-07 MED ORDER — IBUPROFEN 600 MG PO TABS
600.0000 mg | ORAL_TABLET | Freq: Four times a day (QID) | ORAL | Status: DC
  Administered 2013-09-07 – 2013-09-09 (×9): 600 mg via ORAL
  Filled 2013-09-07 (×9): qty 1

## 2013-09-07 NOTE — Anesthesia Procedure Notes (Signed)
Epidural Patient location during procedure: OB Start time: 09/07/2013 9:16 AM End time: 09/07/2013 9:20 AM  Staffing Anesthesiologist: Leilani AbleHATCHETT, Bellanie Matthew Performed by: anesthesiologist   Preanesthetic Checklist Completed: patient identified, surgical consent, pre-op evaluation, timeout performed, IV checked, risks and benefits discussed and monitors and equipment checked  Epidural Patient position: sitting Prep: site prepped and draped and DuraPrep Patient monitoring: continuous pulse ox and blood pressure Approach: midline Location: L3-L4 Injection technique: LOR air  Needle:  Needle type: Tuohy  Needle gauge: 17 G Needle length: 9 cm and 9 Needle insertion depth: 5 cm cm Catheter type: closed end flexible Catheter size: 19 Gauge Catheter at skin depth: 10 cm Test dose: negative and Other  Assessment Sensory level: T9 Events: blood not aspirated, injection not painful, no injection resistance, negative IV test and no paresthesia  Additional Notes Reason for block:procedure for pain

## 2013-09-07 NOTE — Progress Notes (Signed)
113 cbg

## 2013-09-07 NOTE — Progress Notes (Signed)
Patient is doing well. On 10 mu Pitocin FHR is category 1 Toco UCs every 3 minutes Cervix is 90%/3.5/-2 arom  Clear fluid  IMPRESSION: IUP at 36 w 4 days IDDM on insulin pump Mature fetal lung profile - amniocentesis yesterday with LS 2.5 with PG  PLAN: Continue pitocin augmentation Epidural Continue insulin pump

## 2013-09-07 NOTE — Anesthesia Preprocedure Evaluation (Signed)
Anesthesia Evaluation  Patient identified by MRN, date of birth, ID band Patient awake    Reviewed: Allergy & Precautions, H&P , NPO status , Patient's Chart, lab work & pertinent test results  Airway Mallampati: I TM Distance: >3 FB Neck ROM: full    Dental no notable dental hx.    Pulmonary neg pulmonary ROS,    Pulmonary exam normal       Cardiovascular hypertension,     Neuro/Psych negative neurological ROS  negative psych ROS   GI/Hepatic negative GI ROS, Neg liver ROS,   Endo/Other  diabetes, Gestational  Renal/GU negative Renal ROS     Musculoskeletal   Abdominal Normal abdominal exam  (+)   Peds  Hematology negative hematology ROS (+)   Anesthesia Other Findings   Reproductive/Obstetrics (+) Pregnancy                           Anesthesia Physical Anesthesia Plan  ASA: II  Anesthesia Plan: Epidural   Post-op Pain Management:    Induction:   Airway Management Planned:   Additional Equipment:   Intra-op Plan:   Post-operative Plan:   Informed Consent: I have reviewed the patients History and Physical, chart, labs and discussed the procedure including the risks, benefits and alternatives for the proposed anesthesia with the patient or authorized representative who has indicated his/her understanding and acceptance.     Plan Discussed with:   Anesthesia Plan Comments:         Anesthesia Quick Evaluation

## 2013-09-08 LAB — GLUCOSE, CAPILLARY
GLUCOSE-CAPILLARY: 97 mg/dL (ref 70–99)
GLUCOSE-CAPILLARY: 77 mg/dL (ref 70–99)
Glucose-Capillary: 116 mg/dL — ABNORMAL HIGH (ref 70–99)
Glucose-Capillary: 58 mg/dL — ABNORMAL LOW (ref 70–99)
Glucose-Capillary: 58 mg/dL — ABNORMAL LOW (ref 70–99)
Glucose-Capillary: 122 mg/dL — ABNORMAL HIGH (ref 70–99)

## 2013-09-08 LAB — CBC
HEMATOCRIT: 35.2 % — AB (ref 36.0–46.0)
Hemoglobin: 11.8 g/dL — ABNORMAL LOW (ref 12.0–15.0)
MCH: 30.6 pg (ref 26.0–34.0)
MCHC: 33.5 g/dL (ref 30.0–36.0)
MCV: 91.4 fL (ref 78.0–100.0)
Platelets: 207 10*3/uL (ref 150–400)
RBC: 3.85 MIL/uL — ABNORMAL LOW (ref 3.87–5.11)
RDW: 13 % (ref 11.5–15.5)
WBC: 11.4 10*3/uL — ABNORMAL HIGH (ref 4.0–10.5)

## 2013-09-08 NOTE — Progress Notes (Signed)
Post Partum Day 1 Subjective: no complaints She has made adjustments in insulin pump.  Objective: Blood pressure 147/94, pulse 68, temperature 97.9 F (36.6 C), temperature source Oral, resp. rate 20, height 5\' 5"  (1.651 m), weight 72.576 kg (160 lb), unknown if currently breastfeeding.  Physical Exam:  General: alert, cooperative and appears stated age Lochia: appropriate Uterine Fundus: firm Incision: healing well DVT Evaluation: No evidence of DVT seen on physical exam.   Recent Labs  09/06/13 2000 09/08/13 0615  HGB 11.8* 11.8*  HCT 34.9* 35.2*    Assessment/Plan: Plan for discharge tomorrow   LOS: 2 days   Catlin Doria L 09/08/2013, 9:09 AM

## 2013-09-08 NOTE — Lactation Note (Signed)
This note was copied from the chart of Stacey Hilbert OdorShawna Meckley. Lactation Consultation Note     Initial consult with this mom of a 8 lb 3.2 oz 36 4/[redacted] weeks gestation baby.(IDM) This is mom's third baby and third LPT. She had a low milk supply with her last baby, but has had lots of colostrum with baby, evidenced by multiple wet diapers and stool in first 24 hours of life. Mom was breast feeding in cradle hold. I advised and showed mom how to use cross cradle hold for the first 2-3 weeks, until baby is able to self latch. Mom is a type 1 diabetic, on insulin pump, and has celiac disease,  - she is doing well with breast feeding, and denies having questions about breast feeding at this time. Lactation services briefly reviewed with mom. Mom knows to call for questions/concerns  Patient Name: Stacey Bell [id number]'s Date: 09/08/2013 Reason for consult: Initial assessment   Maternal Data Formula Feeding for Exclusion: Yes Reason for exclusion: Mother's choice to formula and breast feed on admission Infant to breast within first hour of birth: Yes Has patient been taught Hand Expression?: No Does the patient have breastfeeding experience prior to this delivery?: Yes  Feeding Feeding Type: Breast Fed Length of feed: 5 min  LATCH Score/Interventions Latch: Grasps breast easily, tongue down, lips flanged, rhythmical sucking.  Audible Swallowing: A few with stimulation Intervention(s): Skin to skin  Type of Nipple: Everted at rest and after stimulation  Comfort (Breast/Nipple): Soft / non-tender     Hold (Positioning): Assistance needed to correctly position infant at breast and maintain latch. Intervention(s): Breastfeeding basics reviewed;Support Pillows;Position options;Skin to skin  LATCH Score: 8  Lactation Tools Discussed/Used     Consult Status Consult Status: Follow-up Date: 09/09/13 Follow-up type: In-patient    Alfred LevinsLee, Adalin Vanderploeg Anne 09/08/2013, 1:59 PM

## 2013-09-08 NOTE — Anesthesia Postprocedure Evaluation (Signed)
  Anesthesia Post-op Note  Anesthesia Post Note  Patient: Stacey Bell  Procedure(s) Performed: * No procedures listed *  Anesthesia type: Epidural  Patient location: Mother/Baby  Post pain: Pain level controlled  Post assessment: Post-op Vital signs reviewed  Last Vitals:  Filed Vitals:   09/08/13 0639  BP: 147/94  Pulse: 68  Temp: 36.6 C  Resp: 20    Post vital signs: Reviewed  Level of consciousness:alert  Complications: No apparent anesthesia complications

## 2013-09-09 LAB — GLUCOSE, CAPILLARY
GLUCOSE-CAPILLARY: 133 mg/dL — AB (ref 70–99)
GLUCOSE-CAPILLARY: 113 mg/dL — AB (ref 70–99)
Glucose-Capillary: 214 mg/dL — ABNORMAL HIGH (ref 70–99)

## 2013-09-09 MED ORDER — OXYCODONE-ACETAMINOPHEN 5-325 MG PO TABS: 1.0000 | ORAL_TABLET | ORAL | Status: DC | PRN

## 2013-09-09 MED ORDER — IBUPROFEN 600 MG PO TABS: 600.0000 mg | ORAL_TABLET | Freq: Four times a day (QID) | ORAL | Status: DC

## 2013-09-09 NOTE — Progress Notes (Signed)
Post Partum Day 2 Subjective: no complaints  Objective: Blood pressure 113/77, pulse 62, temperature 98.4 F (36.9 C), temperature source Oral, resp. rate 18, height 5\' 5"  (1.651 m), weight 72.576 kg (160 lb), unknown if currently breastfeeding.  Physical Exam:  General: alert, cooperative and appears stated age Lochia: appropriate Uterine Fundus: firm Incision: healing well, no significant drainage, no dehiscence, no significant erythema DVT Evaluation: No evidence of DVT seen on physical exam.   Recent Labs  09/06/13 2000 09/08/13 0615  HGB 11.8* 11.8*  HCT 34.9* 35.2*    Assessment/Plan: Discharge home and Breastfeeding   LOS: 3 days   Dymond Spreen L 09/09/2013, 6:25 AM

## 2013-09-09 NOTE — Discharge Summary (Signed)
Obstetric Discharge Summary Reason for Admission: induction of labor Prenatal Procedures: none Intrapartum Procedures: spontaneous vaginal delivery Postpartum Procedures: none Complications-Operative and Postpartum: none Hemoglobin  Date Value Ref Range Status  09/08/2013 11.8* 12.0 - 15.0 g/dL Final     HCT  Date Value Ref Range Status  09/08/2013 35.2* 36.0 - 46.0 % Final    Physical Exam:  General: alert, cooperative and appears stated age 1Lochia: appropriate Uterine Fundus: firm Incision: healing well, no significant drainage, no dehiscence, no significant erythema DVT Evaluation: No evidence of DVT seen on physical exam.  Discharge Diagnoses: Preterm delivery, IDDM  Discharge Information: Date: 09/09/2013 Activity: pelvic rest Diet: routine Medications: Ibuprofen and Percocet Condition: improved Instructions: refer to practice specific booklet Discharge to: home   Newborn Data: Live born female  Birth Weight: 8 lb 6.4 oz (3810 g) APGAR: 8, 9  Home with mother.  Benson Porcaro L 09/09/2013, 6:26 AM

## 2013-09-09 NOTE — Lactation Note (Signed)
This note was copied from the chart of [name]. Lactation Consultation Note    Follow up consult with this mom and baby, now 41 hours post partum, and baby with increasing bili, now under double phototherapy. Mom repots breast feeding going well. She is pumping - has pumed once so far, and fed the baby 3 mls of EBM as supplement. I advised her to pump about every 3 hours, and feed the baby what she expresses. Part care reviewed with mom. Mom knows to call for questions/concerns  Patient Name: Girl Hilbert OdorShawna Waldrop [id number]'s Date: 09/09/2013 Reason for consult: Follow-up assessment   Maternal Data    Feeding Feeding Type: Breast Fed Length of feed: 10 min  LATCH Score/Interventions Latch: Grasps breast easily, tongue down, lips flanged, rhythmical sucking.  Audible Swallowing: A few with stimulation  Type of Nipple: Everted at rest and after stimulation  Comfort (Breast/Nipple): Filling, red/small blisters or bruises, mild/mod discomfort  Problem noted: Mild/Moderate discomfort Interventions (Mild/moderate discomfort): Comfort gels  Hold (Positioning): No assistance needed to correctly position infant at breast. Intervention(s): Support Pillows;Skin to skin  LATCH Score: 8  Lactation Tools Discussed/Used Tools: Pump Breast pump type: Double-Electric Breast Pump Pump Review: Setup, frequency, and cleaning Initiated by:: bedside rn Date initiated:: 09/08/13   Consult Status Consult Status: Complete Follow-up type: Call as needed    Alfred LevinsLee, Talana Slatten Anne 09/09/2013, 11:18 AM

## 2013-10-17 ENCOUNTER — Other Ambulatory Visit: Payer: Self-pay | Admitting: Obstetrics and Gynecology

## 2014-04-22 ENCOUNTER — Encounter (HOSPITAL_COMMUNITY): Payer: Self-pay

## 2015-02-03 ENCOUNTER — Other Ambulatory Visit: Payer: Self-pay | Admitting: Obstetrics and Gynecology

## 2015-02-03 LAB — OB RESULTS CONSOLE RUBELLA ANTIBODY, IGM: RUBELLA: IMMUNE

## 2015-02-03 LAB — OB RESULTS CONSOLE GC/CHLAMYDIA
CHLAMYDIA, DNA PROBE: NEGATIVE
GC PROBE AMP, GENITAL: NEGATIVE

## 2015-02-03 LAB — OB RESULTS CONSOLE RPR: RPR: NONREACTIVE

## 2015-02-03 LAB — OB RESULTS CONSOLE HIV ANTIBODY (ROUTINE TESTING): HIV: NONREACTIVE

## 2015-02-04 LAB — CYTOLOGY - PAP

## 2015-02-26 ENCOUNTER — Inpatient Hospital Stay (HOSPITAL_COMMUNITY)
Admission: AD | Admit: 2015-02-26 | Payer: Medicaid Other | Source: Ambulatory Visit | Admitting: Obstetrics and Gynecology

## 2015-06-22 NOTE — L&D Delivery Note (Signed)
Delivery Note At 11:50 AM a viable female was delivered via OA Presentation Apgars 9 9 weight pending  Placenta spontaneously with 3 vessel cord : , .  Cord:  with the following complications:none .  Cord pH: not obtained  Anesthesia: Epidural  Episiotomy:  none Lacerations:  none Suture Repair: none Est. Blood Loss (mL):  300  Mom to postpartum.  Baby to Couplet care / Skin to Skin.  Seraphim Trow L 08/05/2015, 11:58 AM

## 2015-07-11 ENCOUNTER — Inpatient Hospital Stay (HOSPITAL_COMMUNITY)
Admission: AD | Admit: 2015-07-11 | Discharge: 2015-07-11 | Disposition: A | Discharge status: Home / Self Care | Payer: BLUE CROSS/BLUE SHIELD | Source: Ambulatory Visit | Attending: Obstetrics & Gynecology | Admitting: Obstetrics & Gynecology

## 2015-07-11 ENCOUNTER — Encounter (HOSPITAL_COMMUNITY): Payer: Self-pay

## 2015-07-11 ENCOUNTER — Inpatient Hospital Stay (HOSPITAL_COMMUNITY): Payer: BLUE CROSS/BLUE SHIELD

## 2015-07-11 DIAGNOSIS — O2690 Pregnancy related conditions, unspecified, unspecified trimester: Secondary | ICD-10-CM | POA: Diagnosis not present

## 2015-07-11 DIAGNOSIS — Z3A33 33 weeks gestation of pregnancy: Secondary | ICD-10-CM | POA: Insufficient documentation

## 2015-07-11 DIAGNOSIS — E119 Type 2 diabetes mellitus without complications: Secondary | ICD-10-CM | POA: Diagnosis not present

## 2015-07-11 DIAGNOSIS — E039 Hypothyroidism, unspecified: Secondary | ICD-10-CM | POA: Diagnosis not present

## 2015-07-11 DIAGNOSIS — O09219 Supervision of pregnancy with history of pre-term labor, unspecified trimester: Secondary | ICD-10-CM | POA: Insufficient documentation

## 2015-07-11 DIAGNOSIS — Z9641 Presence of insulin pump (external) (internal): Secondary | ICD-10-CM | POA: Insufficient documentation

## 2015-07-11 DIAGNOSIS — Z794 Long term (current) use of insulin: Secondary | ICD-10-CM | POA: Insufficient documentation

## 2015-07-11 DIAGNOSIS — O09213 Supervision of pregnancy with history of pre-term labor, third trimester: Secondary | ICD-10-CM

## 2015-07-11 DIAGNOSIS — O99283 Endocrine, nutritional and metabolic diseases complicating pregnancy, third trimester: Secondary | ICD-10-CM

## 2015-07-11 DIAGNOSIS — O09299 Supervision of pregnancy with other poor reproductive or obstetric history, unspecified trimester: Secondary | ICD-10-CM | POA: Insufficient documentation

## 2015-07-11 DIAGNOSIS — O289 Unspecified abnormal findings on antenatal screening of mother: Secondary | ICD-10-CM | POA: Diagnosis not present

## 2015-07-11 DIAGNOSIS — O24113 Pre-existing diabetes mellitus, type 2, in pregnancy, third trimester: Secondary | ICD-10-CM | POA: Insufficient documentation

## 2015-07-11 DIAGNOSIS — O24313 Unspecified pre-existing diabetes mellitus in pregnancy, third trimester: Secondary | ICD-10-CM | POA: Diagnosis not present

## 2015-07-11 DIAGNOSIS — O99280 Endocrine, nutritional and metabolic diseases complicating pregnancy, unspecified trimester: Secondary | ICD-10-CM | POA: Insufficient documentation

## 2015-07-11 DIAGNOSIS — E0590 Thyrotoxicosis, unspecified without thyrotoxic crisis or storm: Secondary | ICD-10-CM

## 2015-07-11 DIAGNOSIS — O09293 Supervision of pregnancy with other poor reproductive or obstetric history, third trimester: Secondary | ICD-10-CM

## 2015-07-11 DIAGNOSIS — O288 Other abnormal findings on antenatal screening of mother: Secondary | ICD-10-CM

## 2015-07-11 DIAGNOSIS — O09893 Supervision of other high risk pregnancies, third trimester: Secondary | ICD-10-CM

## 2015-07-11 NOTE — MAU Provider Note (Signed)
History     CSN: [id number]  Arrival date and time: 07/11/15 1309   First Provider Initiated Contact with Patient 07/11/15 1326      No chief complaint on file.  HPI Stacey Bell is a 25 y.o. [id number] at [redacted]w[redacted]d who presents to MAU today from the office for further evaluation after non-reactive NST in the office. She is high risk due to DM on insulin pump. She denies abdominal pain, vaginal bleeding or LOF today. She states only few irregular Braxton Hicks contractions. She reports normal fetal movement.   OB History    Gravida Para Term Preterm AB TAB SAB Ectopic Multiple Living   5 3 0 Past Medical History  Diagnosis Date  . Celiac disease   . Pregnancy induced hypertension   . Diabetes mellitus     insulin  . Hypothyroidism     History reviewed. No pertinent past surgical history.  Family History  Problem Relation Age of Onset  . Birth defects Daughter     truncus arteriosus  . Cancer Paternal Uncle     skin  . Hypertension Maternal Grandmother   . Heart disease Paternal Grandfather     Social History  Substance Use Topics  . Smoking status: Never Smoker   . Smokeless tobacco: Never Used  . Alcohol Use: No    Allergies:  Allergies  Allergen Reactions  . Gluten     Pt has celiac disease    Prescriptions prior to admission  Medication Sig Dispense Refill Last Dose  . Insulin Human (INSULIN PUMP) SOLN Inject 1 each into the skin continuous. Uses Humalog.   continuous at Unknown time  . levothyroxine (SYNTHROID, LEVOTHROID) 112 MCG tablet Take 112 mcg by mouth daily before breakfast.   07/11/2015 at Unknown time  . Prenatal Vit-Fe Fumarate-FA (PRENATAL MULTIVITAMIN) TABS Take 1 tablet by mouth daily.    07/10/2015 at Unknown time  . [DISCONTINUED] ibuprofen (ADVIL,MOTRIN) 600 MG tablet Take 1 tablet (600 mg total) by mouth every 6 (six) hours. (Patient not taking: Reported on 07/11/2015) 30 tablet 0 Not Taking at Unknown time  .  [DISCONTINUED] oxyCODONE-acetaminophen (PERCOCET/ROXICET) 5-325 MG per tablet Take 1-2 tablets by mouth every 4 (four) hours as needed for severe pain (moderate - severe pain). (Patient not taking: Reported on 07/11/2015) 30 tablet 0 Not Taking at Unknown time    Review of Systems  Constitutional: Negative for fever and malaise/fatigue.  Gastrointestinal: Negative for abdominal pain.  Genitourinary:       Neg - vaginal bleeding, discharge, LOF   Physical Exam   Blood pressure 111/64, pulse 104, temperature 98.7 F (37.1 C), temperature source Oral, resp. rate 16, unknown if currently breastfeeding.  Physical Exam  Nursing note and vitals reviewed. Constitutional: She is oriented to person, place, and time. She appears well-developed and well-nourished. No distress.  HENT:  Head: Normocephalic and atraumatic.  Cardiovascular: Normal rate.   Respiratory: Effort normal.  GI: Soft. She exhibits no distension and no mass. There is no tenderness. There is no rebound and no guarding.  Neurological: She is alert and oriented to person, place, and time.  Skin: Skin is warm and dry. No erythema.  Psychiatric: She has a normal mood and affect.   Fetal Monitoring: Baseline: 140 bpm Variability: moderate Accelerations: 25 x 25, few Decelerations: none Contractions: none  MAU Course  Procedures None  MDM Discussed with Dr. Langston Masker. Per Dr. Henderson Cloud, patient was  non-reactive on office NST and is high risk. Recommends NST and BPP today in MAU.  BPP ordered. Preliminary [place] reports shows normal AFI, FHR and BPP 8/8. Frank breech presentation.  Discussed results with Dr. Langston Masker. She states patient is ok for discharge at this time. She will need to call the office on Monday morning for an appointment to follow-up Assessment and Plan  A: SIUP at [redacted]w[redacted]d DM in pregnancy, insulin dependent Hypothyroidism  P: Discharge home Preterm labor precautions and kick counts discussed Patient advised to  follow-up with [facility] on Monday. She will need to call for an appointment time.  Patient may return to MAU as needed or if her condition were to change or worsen  Marny Lowenstein, PA-C  07/11/2015, 2:31 PM

## 2015-07-11 NOTE — MAU Note (Signed)
Patient was seen at MD office this morning sent for NST and BPP.

## 2015-07-11 NOTE — Discharge Instructions (Signed)
Fetal Movement Counts  Patient Name: __________________________________________________ Patient Due Date: ____________________  Performing a fetal movement count is highly recommended in high-risk pregnancies, but it is good for every pregnant woman to do. Your health care provider may ask you to start counting fetal movements at 28 weeks of the pregnancy. Fetal movements often increase:  · After eating a full meal.  · After physical activity.  · After eating or drinking something sweet or cold.  · At rest.  Pay attention to when you feel the baby is most active. This will help you notice a pattern of your baby's sleep and wake cycles and what factors contribute to an increase in fetal movement. It is important to perform a fetal movement count at the same time each day when your baby is normally most active.   HOW TO COUNT FETAL MOVEMENTS  1. Find a quiet and comfortable area to sit or lie down on your left side. Lying on your left side provides the best blood and oxygen circulation to your baby.  2. Write down the day and time on a sheet of paper or in a journal.  3. Start counting kicks, flutters, swishes, rolls, or jabs in a 2-hour period. You should feel at least 10 movements within 2 hours.  4. If you do not feel 10 movements in 2 hours, wait 2-3 hours and count again. Look for a change in the pattern or not enough counts in 2 hours.  SEEK MEDICAL CARE IF:  · You feel less than 10 counts in 2 hours, tried twice.  · There is no movement in over an hour.  · The pattern is changing or taking longer each day to reach 10 counts in 2 hours.  · You feel the baby is not moving as he or she usually does.  Date: ____________ Movements: ____________ Start time: ____________ Finish time: ____________   Date: ____________ Movements: ____________ Start time: ____________ Finish time: ____________  Date: ____________ Movements: ____________ Start time: ____________ Finish time: ____________  Date: ____________ Movements:  ____________ Start time: ____________ Finish time: ____________  Date: ____________ Movements: ____________ Start time: ____________ Finish time: ____________  Date: ____________ Movements: ____________ Start time: ____________ Finish time: ____________  Date: ____________ Movements: ____________ Start time: ____________ Finish time: ____________  Date: ____________ Movements: ____________ Start time: ____________ Finish time: ____________   Date: ____________ Movements: ____________ Start time: ____________ Finish time: ____________  Date: ____________ Movements: ____________ Start time: ____________ Finish time: ____________  Date: ____________ Movements: ____________ Start time: ____________ Finish time: ____________  Date: ____________ Movements: ____________ Start time: ____________ Finish time: ____________  Date: ____________ Movements: ____________ Start time: ____________ Finish time: ____________  Date: ____________ Movements: ____________ Start time: ____________ Finish time: ____________  Date: ____________ Movements: ____________ Start time: ____________ Finish time: ____________   Date: ____________ Movements: ____________ Start time: ____________ Finish time: ____________  Date: ____________ Movements: ____________ Start time: ____________ Finish time: ____________  Date: ____________ Movements: ____________ Start time: ____________ Finish time: ____________  Date: ____________ Movements: ____________ Start time: ____________ Finish time: ____________  Date: ____________ Movements: ____________ Start time: ____________ Finish time: ____________  Date: ____________ Movements: ____________ Start time: ____________ Finish time: ____________  Date: ____________ Movements: ____________ Start time: ____________ Finish time: ____________   Date: ____________ Movements: ____________ Start time: ____________ Finish time: ____________  Date: ____________ Movements: ____________ Start time: ____________ Finish  time: ____________  Date: ____________ Movements: ____________ Start time: ____________ Finish time: ____________  Date: ____________ Movements: ____________ Start time:   ____________ Finish time: ____________  Date: ____________ Movements: ____________ Start time: ____________ Finish time: ____________  Date: ____________ Movements: ____________ Start time: ____________ Finish time: ____________  Date: ____________ Movements: ____________ Start time: ____________ Finish time: ____________   Date: ____________ Movements: ____________ Start time: ____________ Finish time: ____________  Date: ____________ Movements: ____________ Start time: ____________ Finish time: ____________  Date: ____________ Movements: ____________ Start time: ____________ Finish time: ____________  Date: ____________ Movements: ____________ Start time: ____________ Finish time: ____________  Date: ____________ Movements: ____________ Start time: ____________ Finish time: ____________  Date: ____________ Movements: ____________ Start time: ____________ Finish time: ____________  Date: ____________ Movements: ____________ Start time: ____________ Finish time: ____________   Date: ____________ Movements: ____________ Start time: ____________ Finish time: ____________  Date: ____________ Movements: ____________ Start time: ____________ Finish time: ____________  Date: ____________ Movements: ____________ Start time: ____________ Finish time: ____________  Date: ____________ Movements: ____________ Start time: ____________ Finish time: ____________  Date: ____________ Movements: ____________ Start time: ____________ Finish time: ____________  Date: ____________ Movements: ____________ Start time: ____________ Finish time: ____________  Date: ____________ Movements: ____________ Start time: ____________ Finish time: ____________   Date: ____________ Movements: ____________ Start time: ____________ Finish time: ____________  Date: ____________  Movements: ____________ Start time: ____________ Finish time: ____________  Date: ____________ Movements: ____________ Start time: ____________ Finish time: ____________  Date: ____________ Movements: ____________ Start time: ____________ Finish time: ____________  Date: ____________ Movements: ____________ Start time: ____________ Finish time: ____________  Date: ____________ Movements: ____________ Start time: ____________ Finish time: ____________  Date: ____________ Movements: ____________ Start time: ____________ Finish time: ____________   Date: ____________ Movements: ____________ Start time: ____________ Finish time: ____________  Date: ____________ Movements: ____________ Start time: ____________ Finish time: ____________  Date: ____________ Movements: ____________ Start time: ____________ Finish time: ____________  Date: ____________ Movements: ____________ Start time: ____________ Finish time: ____________  Date: ____________ Movements: ____________ Start time: ____________ Finish time: ____________  Date: ____________ Movements: ____________ Start time: ____________ Finish time: ____________     This information is not intended to replace advice given to you by your health care provider. Make sure you discuss any questions you have with your health care provider.     Document Released: 07/07/2006 Document Revised: 06/28/2014 Document Reviewed: 04/03/2012  Elsevier Interactive Patient Education ©2016 [employer]

## 2015-07-23 LAB — OB RESULTS CONSOLE GBS: GBS: POSITIVE

## 2015-07-24 ENCOUNTER — Telehealth (HOSPITAL_COMMUNITY): Payer: Self-pay | Admitting: *Deleted

## 2015-07-24 NOTE — Telephone Encounter (Signed)
Preadmission screen  

## 2015-08-01 ENCOUNTER — Encounter (HOSPITAL_COMMUNITY): Payer: Self-pay

## 2015-08-01 ENCOUNTER — Ambulatory Visit (HOSPITAL_COMMUNITY)
Admission: RE | Admit: 2015-08-01 | Discharge: 2015-08-01 | Disposition: A | Discharge status: Home / Self Care | Payer: BLUE CROSS/BLUE SHIELD | Source: Ambulatory Visit | Attending: Obstetrics and Gynecology | Admitting: Obstetrics and Gynecology

## 2015-08-01 DIAGNOSIS — E1065 Type 1 diabetes mellitus with hyperglycemia: Secondary | ICD-10-CM | POA: Insufficient documentation

## 2015-08-01 DIAGNOSIS — Z3A36 36 weeks gestation of pregnancy: Secondary | ICD-10-CM | POA: Diagnosis not present

## 2015-08-01 DIAGNOSIS — O24013 Pre-existing diabetes mellitus, type 1, in pregnancy, third trimester: Secondary | ICD-10-CM | POA: Diagnosis present

## 2015-08-01 DIAGNOSIS — O99283 Endocrine, nutritional and metabolic diseases complicating pregnancy, third trimester: Secondary | ICD-10-CM | POA: Diagnosis not present

## 2015-08-01 DIAGNOSIS — Z9641 Presence of insulin pump (external) (internal): Secondary | ICD-10-CM | POA: Insufficient documentation

## 2015-08-01 DIAGNOSIS — E039 Hypothyroidism, unspecified: Secondary | ICD-10-CM | POA: Insufficient documentation

## 2015-08-01 DIAGNOSIS — Z794 Long term (current) use of insulin: Secondary | ICD-10-CM | POA: Diagnosis not present

## 2015-08-01 NOTE — Consult Note (Signed)
Maternal Fetal Medicine Consultation  Requesting Provider(s): Marcelle Overlie, MD  Reason for consultation: Type 1 diabetes - recommendations for timing of delivery  HPI: Stacey Bell is a 25 yo [id number], EDD 08/29/2015 who is currently at 36w 0d seen for consultation to discuss timing of delivery.  Stacey Bell has a history of Type 1 diabetes since age 45 (Class D diabetes) but does not have any known vasculopathy - reports normal evaluation of the eye grounds this pregnancy, and normal renal function.  She is currently on an insulin pump - followed by the [facility].  Normally, she is fairly well-controlled, but over the last month or so, she has required frequent adjustments to insulin regimen several times a week.  Review of fingerstick values over the last 2 weeks - fasting values in the 110-234 range; post prandial values frequently in the 200+ range.  She reports having an ultrasound in the clinic yesterday that showed an estimated fetal weight of 7lbs 6oz (> 90th %tile).    Stacey Bell also reports a history of celiac disease and hypothyroidism.  Her prenatal course has otherwise been uncomplicated.  OB History: OB History    Gravida Para Term Preterm AB TAB SAB Ectopic Multiple Living   5 3 0 G1 - 2010- NSVD at 36w 4d - induction due to preeclampsia  7 lbs 1oz.  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 Celiac disease   . Pregnancy induced hypertension   . Diabetes mellitus     insulin  . Hypothyroidism     PSH: No past surgical history on file.   Meds:  Current Outpatient Prescriptions on File Prior to Encounter  Medication Sig Dispense Refill  . Insulin Human (INSULIN PUMP) SOLN Inject 1 each into the skin continuous. Uses Humalog.    . levothyroxine (SYNTHROID, LEVOTHROID) 112 MCG  tablet Take 112 mcg by mouth daily before breakfast.    . Prenatal Vit-Fe Fumarate-FA (PRENATAL MULTIVITAMIN) TABS Take 1 tablet by mouth daily.      No current facility-administered medications on file prior to encounter.   Allergies:  Allergies  Allergen Reactions  . Gluten     Pt has celiac disease   FH:  Family History  Problem Relation Age of Onset  . Birth defects Daughter     truncus arteriosus  . Cancer Paternal Uncle     skin  . Hypertension Maternal Grandmother   . Heart disease Paternal Grandfather    Soc:  Social History   Social History  . Marital Status: Married    Spouse Name: N/A  . Number of Children: N/A  . Years of Education: N/A   Occupational History  . Not on file.   Social History Main Topics  . Smoking status: Never Smoker   . Smokeless tobacco: Never Used  . Alcohol Use: No  . Drug Use: No  . Sexual Activity: Yes    Birth Control/ Protection: Pill   Other Topics Concern  . Not on file   Social History Narrative    Review of Systems: no vaginal bleeding or cramping/contractions, no LOF, no nausea/vomiting. All other systems reviewed and are negative.  PE:   Filed Vitals:  08/01/15 0913  BP: 129/83  Pulse: 86    A/P: 1) Single IUP at 36w 0d  2) Suspected fetal macrosomia  3) Hypothyroidism  4) Poorly controlled type 1 diabetes (Class D) - poorly controlled diabetics are at increased risk for stillbirth near term.  Based on my reviews of her fingerstick values and suspected fetal macrosomia, feel that Stacey Bell fits into this category.  ACOG guidelines for late preterm / early term deliveries in poorly controlled diabetics is at 34-39 weeks (individualized to situation).  Diabetics are also at increased risk for RDS and metabolic derangements of the newborn, and would therefore try to postpone delivery at least until 37 weeks if possible.  My recommendation would be to proceed with induction of labor at 37 weeks with a mature fetal  lung profile by amniocentesis or induction of labor without amniocentesis at 38 weeks in the absence of any other complications.  In the meantime, would continue close antenatal testing (2x weekly NSTS with weekly AFIs).  Thank you for the opportunity to be a part of the care of Stacey Bell. Please contact our office if we can be of further assistance.   I spent approximately 30 minutes with this patient with over 50% of time spent in face-to-face counseling.  Alpha Gula, MD Maternal Fetal Medicine

## 2015-08-03 ENCOUNTER — Encounter (HOSPITAL_COMMUNITY): Payer: Self-pay | Admitting: *Deleted

## 2015-08-03 ENCOUNTER — Inpatient Hospital Stay (EMERGENCY_DEPARTMENT_HOSPITAL)
Admission: AD | Admit: 2015-08-03 | Discharge: 2015-08-03 | Disposition: A | Discharge status: Home / Self Care | Payer: BLUE CROSS/BLUE SHIELD | Source: Ambulatory Visit | Attending: Obstetrics and Gynecology | Admitting: Obstetrics and Gynecology

## 2015-08-03 DIAGNOSIS — Z3A36 36 weeks gestation of pregnancy: Secondary | ICD-10-CM

## 2015-08-03 DIAGNOSIS — O42913 Preterm premature rupture of membranes, unspecified as to length of time between rupture and onset of labor, third trimester: Secondary | ICD-10-CM | POA: Diagnosis not present

## 2015-08-03 LAB — AMNISURE RUPTURE OF MEMBRANE (ROM) NOT AT ARMC: AMNISURE: NEGATIVE

## 2015-08-03 LAB — POCT FERN TEST: POCT Fern Test: NEGATIVE

## 2015-08-03 NOTE — MAU Provider Note (Signed)
Pt is [redacted]w[redacted]d [id number] pregnant seen to R/O ROM.  Pt is diabetic on insulin pump with fairly well controlled BS. Pt noticed a small gush of fluid and has been leaking since.  Pt has a few ctx. Pt was checked in the office and was 2 cm.80%effaced Exam: Small amount of yellow slightly watery discharge in vault-( Fern and Amniosure negative) Cervix ~2cm- unchanged Imp- neg ROM Reactive NST FHR 135 bpm Disp- RN labor eval Jean Rosenthal, NP RN note: Stacey Cunas, RN (Registered Nurse)     Expand All Collapse All   Pt states contractions started at 1900 last night but they spaced out this morning. Pt states after she went to the bathroom this morning she waited 5 minutes and then she had a small gush of fluid and states she has been leaking since. She states she had to wear a panty liner to the hospital. Pt states she is feeling the baby move.

## 2015-08-03 NOTE — Progress Notes (Signed)
Provider notified of pt in MAU.  Provider notified of previous history of deliveries within the 36 week range.  Provider notified of negative fern and amnisure.  Provider notified of reactive FHR tracing.  Provider states to discharge pt.

## 2015-08-03 NOTE — MAU Note (Signed)
Pt states contractions started at 1900 last night but they spaced out this morning.  Pt states after she went to the bathroom this morning she waited 5 minutes and then she had a small gush of fluid and states she has been leaking since.  She states she had to wear a panty liner to the hospital.  Pt states she is feeling the baby move.

## 2015-08-03 NOTE — Discharge Instructions (Signed)
Third Trimester of Pregnancy °The third trimester is from week 29 through week 42, months 7 through 9. The third trimester is a time when the fetus is growing rapidly. At the end of the ninth month, the fetus is about 20 inches in length and weighs 6-10 pounds.  °BODY CHANGES °Your body goes through many changes during pregnancy. The changes vary from woman to woman.  °· Your weight will continue to increase. You can expect to gain 25-35 pounds (11-16 kg) by the end of the pregnancy. °· You may begin to get stretch marks on your hips, abdomen, and breasts. °· You may urinate more often because the fetus is moving lower into your pelvis and pressing on your bladder. °· You may develop or continue to have heartburn as a result of your pregnancy. °· You may develop constipation because certain hormones are causing the muscles that push waste through your intestines to slow down. °· You may develop hemorrhoids or swollen, bulging veins (varicose veins). °· You may have pelvic pain because of the weight gain and pregnancy hormones relaxing your joints between the bones in your pelvis. Backaches may result from overexertion of the muscles supporting your posture. °· You may have changes in your hair. These can include thickening of your hair, rapid growth, and changes in texture. Some women also have hair loss during or after pregnancy, or hair that feels dry or thin. Your hair will most likely return to normal after your baby is born. °· Your breasts will continue to grow and be tender. A yellow discharge may leak from your breasts called colostrum. °· Your belly button may stick out. °· You may feel short of breath because of your expanding uterus. °· You may notice the fetus "dropping," or moving lower in your abdomen. °· You may have a bloody mucus discharge. This usually occurs a few days to a week before labor begins. °· Your cervix becomes thin and soft (effaced) near your due date. °WHAT TO EXPECT AT YOUR PRENATAL  EXAMS  °You will have prenatal exams every 2 weeks until week 36. Then, you will have weekly prenatal exams. During a routine prenatal visit: °· You will be weighed to make sure you and the fetus are growing normally. °· Your blood pressure is taken. °· Your abdomen will be measured to track your baby's growth. °· The fetal heartbeat will be listened to. °· Any test results from the previous visit will be discussed. °· You may have a cervical check near your due date to see if you have effaced. °At around 36 weeks, your caregiver will check your cervix. At the same time, your caregiver will also perform a test on the secretions of the vaginal tissue. This test is to determine if a type of bacteria, Group B streptococcus, is present. Your caregiver will explain this further. °Your caregiver may ask you: °· What your birth plan is. °· How you are feeling. °· If you are feeling the baby move. °· If you have had any abnormal symptoms, such as leaking fluid, bleeding, severe headaches, or abdominal cramping. °· If you are using any tobacco products, including cigarettes, chewing tobacco, and electronic cigarettes. °· If you have any questions. °Other tests or screenings that may be performed during your third trimester include: °· Blood tests that check for low iron levels (anemia). °· Fetal testing to check the health, activity level, and growth of the fetus. Testing is done if you have certain medical conditions or if   there are problems during the pregnancy. °· HIV (human immunodeficiency virus) testing. If you are at high risk, you may be screened for HIV during your third trimester of pregnancy. °FALSE LABOR °You may feel small, irregular contractions that eventually go away. These are called Braxton Hicks contractions, or false labor. Contractions may last for hours, days, or even weeks before true labor sets in. If contractions come at regular intervals, intensify, or become painful, it is best to be seen by your  caregiver.  °SIGNS OF LABOR  °· Menstrual-like cramps. °· Contractions that are 5 minutes apart or less. °· Contractions that start on the top of the uterus and spread down to the lower abdomen and back. °· A sense of increased pelvic pressure or back pain. °· A watery or bloody mucus discharge that comes from the vagina. °If you have any of these signs before the 37th week of pregnancy, call your caregiver right away. You need to go to the hospital to get checked immediately. °HOME CARE INSTRUCTIONS  °· Avoid all smoking, herbs, alcohol, and unprescribed drugs. These chemicals affect the formation and growth of the baby. °· Do not use any tobacco products, including cigarettes, chewing tobacco, and electronic cigarettes. If you need help quitting, ask your health care provider. You may receive counseling support and other resources to help you quit. °· Follow your caregiver's instructions regarding medicine use. There are medicines that are either safe or unsafe to take during pregnancy. °· Exercise only as directed by your caregiver. Experiencing uterine cramps is a good sign to stop exercising. °· Continue to eat regular, healthy meals. °· Wear a good support bra for breast tenderness. °· Do not use hot tubs, steam rooms, or saunas. °· Wear your seat belt at all times when driving. °· Avoid raw meat, uncooked cheese, cat litter boxes, and soil used by cats. These carry germs that can cause birth defects in the baby. °· Take your prenatal vitamins. °· Take 1500-2000 mg of calcium daily starting at the 20th week of pregnancy until you deliver your baby. °· Try taking a stool softener (if your caregiver approves) if you develop constipation. Eat more high-fiber foods, such as fresh vegetables or fruit and whole grains. Drink plenty of fluids to keep your urine clear or pale yellow. °· Take warm sitz baths to soothe any pain or discomfort caused by hemorrhoids. Use hemorrhoid cream if your caregiver approves. °· If  you develop varicose veins, wear support hose. Elevate your feet for 15 minutes, 3-4 times a day. Limit salt in your diet. °· Avoid heavy lifting, wear low heal shoes, and practice good posture. °· Rest a lot with your legs elevated if you have leg cramps or low back pain. °· Visit your dentist if you have not gone during your pregnancy. Use a soft toothbrush to brush your teeth and be gentle when you floss. °· A sexual relationship may be continued unless your caregiver directs you otherwise. °· Do not travel far distances unless it is absolutely necessary and only with the approval of your caregiver. °· Take prenatal classes to understand, practice, and ask questions about the labor and delivery. °· Make a trial run to the hospital. °· Pack your hospital bag. °· Prepare the baby's nursery. °· Continue to go to all your prenatal visits as directed by your caregiver. °SEEK MEDICAL CARE IF: °· You are unsure if you are in labor or if your water has broken. °· You have dizziness. °· You have   mild pelvic cramps, pelvic pressure, or nagging pain in your abdominal area. °· You have persistent nausea, vomiting, or diarrhea. °· You have a bad smelling vaginal discharge. °· You have pain with urination. °SEEK IMMEDIATE MEDICAL CARE IF:  °· You have a fever. °· You are leaking fluid from your vagina. °· You have spotting or bleeding from your vagina. °· You have severe abdominal cramping or pain. °· You have rapid weight loss or gain. °· You have shortness of breath with chest pain. °· You notice sudden or extreme swelling of your face, hands, ankles, feet, or legs. °· You have not felt your baby move in over an hour. °· You have severe headaches that do not go away with medicine. °· You have vision changes. °  °This information is not intended to replace advice given to you by your health care provider. Make sure you discuss any questions you have with your health care provider. °  °Document Released: 06/01/2001 Document  Revised: 06/28/2014 Document Reviewed: 08/08/2012 °[employer] Interactive Patient Education ©2016 [employer] °Fetal Movement Counts °Patient Name: __________________________________________________ Patient Due Date: ____________________ °Performing a fetal movement count is highly recommended in high-risk pregnancies, but it is good for every pregnant woman to do. Your health care provider may ask you to start counting fetal movements at 28 weeks of the pregnancy. Fetal movements often increase: °· After eating a full meal. °· After physical activity. °· After eating or drinking something sweet or cold. °· At rest. °Pay attention to when you feel the baby is most active. This will help you notice a pattern of your baby's sleep and wake cycles and what factors contribute to an increase in fetal movement. It is important to perform a fetal movement count at the same time each day when your baby is normally most active.  °HOW TO COUNT FETAL MOVEMENTS °· Find a quiet and comfortable area to sit or lie down on your left side. Lying on your left side provides the best blood and oxygen circulation to your baby. °· Write down the day and time on a sheet of paper or in a journal. °· Start counting kicks, flutters, swishes, rolls, or jabs in a 2-hour period. You should feel at least 10 movements within 2 hours. °· If you do not feel 10 movements in 2 hours, wait 2-3 hours and count again. Look for a change in the pattern or not enough counts in 2 hours. °SEEK MEDICAL CARE IF: °· You feel less than 10 counts in 2 hours, tried twice. °· There is no movement in over an hour. °· The pattern is changing or taking longer each day to reach 10 counts in 2 hours. °· You feel the baby is not moving as he or she usually does. °Date: ____________ Movements: ____________ Start time: ____________ Finish time: ____________  °Date: ____________ Movements: ____________ Start time: ____________ Finish time: ____________ °Date: ____________  Movements: ____________ Start time: ____________ Finish time: ____________ °Date: ____________ Movements: ____________ Start time: ____________ Finish time: ____________ °Date: ____________ Movements: ____________ Start time: ____________ Finish time: ____________ °Date: ____________ Movements: ____________ Start time: ____________ Finish time: ____________ °Date: ____________ Movements: ____________ Start time: ____________ Finish time: ____________ °Date: ____________ Movements: ____________ Start time: ____________ Finish time: ____________  °Date: ____________ Movements: ____________ Start time: ____________ Finish time: ____________ °Date: ____________ Movements: ____________ Start time: ____________ Finish time: ____________ °Date: ____________ Movements: ____________ Start time: ____________ Finish time: ____________ °Date: ____________ Movements: ____________ Start time: ____________ Finish time: ____________ °Date:   ____________ Movements: ____________ Start time: ____________ Finish time: ____________ °Date: ____________ Movements: ____________ Start time: ____________ Finish time: ____________ °Date: ____________ Movements: ____________ Start time: ____________ Finish time: ____________  °Date: ____________ Movements: ____________ Start time: ____________ Finish time: ____________ °Date: ____________ Movements: ____________ Start time: ____________ Finish time: ____________ °Date: ____________ Movements: ____________ Start time: ____________ Finish time: ____________ °Date: ____________ Movements: ____________ Start time: ____________ Finish time: ____________ °Date: ____________ Movements: ____________ Start time: ____________ Finish time: ____________ °Date: ____________ Movements: ____________ Start time: ____________ Finish time: ____________ °Date: ____________ Movements: ____________ Start time: ____________ Finish time: ____________  °Date: ____________ Movements: ____________ Start time:  ____________ Finish time: ____________ °Date: ____________ Movements: ____________ Start time: ____________ Finish time: ____________ °Date: ____________ Movements: ____________ Start time: ____________ Finish time: ____________ °Date: ____________ Movements: ____________ Start time: ____________ Finish time: ____________ °Date: ____________ Movements: ____________ Start time: ____________ Finish time: ____________ °Date: ____________ Movements: ____________ Start time: ____________ Finish time: ____________ °Date: ____________ Movements: ____________ Start time: ____________ Finish time: ____________  °Date: ____________ Movements: ____________ Start time: ____________ Finish time: ____________ °Date: ____________ Movements: ____________ Start time: ____________ Finish time: ____________ °Date: ____________ Movements: ____________ Start time: ____________ Finish time: ____________ °Date: ____________ Movements: ____________ Start time: ____________ Finish time: ____________ °Date: ____________ Movements: ____________ Start time: ____________ Finish time: ____________ °Date: ____________ Movements: ____________ Start time: ____________ Finish time: ____________ °Date: ____________ Movements: ____________ Start time: ____________ Finish time: ____________  °Date: ____________ Movements: ____________ Start time: ____________ Finish time: ____________ °Date: ____________ Movements: ____________ Start time: ____________ Finish time: ____________ °Date: ____________ Movements: ____________ Start time: ____________ Finish time: ____________ °Date: ____________ Movements: ____________ Start time: ____________ Finish time: ____________ °Date: ____________ Movements: ____________ Start time: ____________ Finish time: ____________ °Date: ____________ Movements: ____________ Start time: ____________ Finish time: ____________ °Date: ____________ Movements: ____________ Start time: ____________ Finish time: ____________  °Date:  ____________ Movements: ____________ Start time: ____________ Finish time: ____________ °Date: ____________ Movements: ____________ Start time: ____________ Finish time: ____________ °Date: ____________ Movements: ____________ Start time: ____________ Finish time: ____________ °Date: ____________ Movements: ____________ Start time: ____________ Finish time: ____________ °Date: ____________ Movements: ____________ Start time: ____________ Finish time: ____________ °Date: ____________ Movements: ____________ Start time: ____________ Finish time: ____________ °Date: ____________ Movements: ____________ Start time: ____________ Finish time: ____________  °Date: ____________ Movements: ____________ Start time: ____________ Finish time: ____________ °Date: ____________ Movements: ____________ Start time: ____________ Finish time: ____________ °Date: ____________ Movements: ____________ Start time: ____________ Finish time: ____________ °Date: ____________ Movements: ____________ Start time: ____________ Finish time: ____________ °Date: ____________ Movements: ____________ Start time: ____________ Finish time: ____________ °Date: ____________ Movements: ____________ Start time: ____________ Finish time: ____________ °  °This information is not intended to replace advice given to you by your health care provider. Make sure you discuss any questions you have with your health care provider. °  °Document Released: 07/07/2006 Document Revised: 06/28/2014 Document Reviewed: 04/03/2012 °[employer] Interactive Patient Education ©2016 [employer] °Braxton Hicks Contractions °Contractions of the uterus can occur throughout pregnancy. Contractions are not always a sign that you are in labor.  °WHAT ARE BRAXTON HICKS CONTRACTIONS?  °Contractions that occur before labor are called Braxton Hicks contractions, or false labor. Toward the end of pregnancy (32-34 weeks), these contractions can develop more often and may become more  forceful. This is not true labor because these contractions do not result in opening (dilatation) and thinning of the cervix. They are sometimes difficult to tell apart from true labor because these contractions can be forceful and people have different pain tolerances. You should   not feel embarrassed if you go to the hospital with false labor. Sometimes, the only way to tell if you are in true labor is for your health care provider to look for changes in the cervix. °If there are no prenatal problems or other health problems associated with the pregnancy, it is completely safe to be sent home with false labor and await the onset of true labor. °HOW CAN YOU TELL THE DIFFERENCE BETWEEN TRUE AND FALSE LABOR? °False Labor °· The contractions of false labor are usually shorter and not as hard as those of true labor.   °· The contractions are usually irregular.   °· The contractions are often felt in the front of the lower abdomen and in the groin.   °· The contractions may go away when you walk around or change positions while lying down.   °· The contractions get weaker and are shorter lasting as time goes on.   °· The contractions do not usually become progressively stronger, regular, and closer together as with true labor.   °True Labor °· Contractions in true labor last 30-70 seconds, become very regular, usually become more intense, and increase in frequency.   °· The contractions do not go away with walking.   °· The discomfort is usually felt in the top of the uterus and spreads to the lower abdomen and low back.   °· True labor can be determined by your health care provider with an exam. This will show that the cervix is dilating and getting thinner.   °WHAT TO REMEMBER °· Keep up with your usual exercises and follow other instructions given by your health care provider.   °· Take medicines as directed by your health care provider.   °· Keep your regular prenatal appointments.   °· Eat and drink lightly if you  think you are going into labor.   °· If Braxton Hicks contractions are making you uncomfortable:   °· Change your position from lying down or resting to walking, or from walking to resting.   °· Sit and rest in a tub of warm water.   °· Drink 2-3 glasses of water. Dehydration may cause these contractions.   °· Do slow and deep breathing several times an hour.   °WHEN SHOULD I SEEK IMMEDIATE MEDICAL CARE? °Seek immediate medical care if: °· Your contractions become stronger, more regular, and closer together.   °· You have fluid leaking or gushing from your vagina.   °· You have a fever.   °· You pass blood-tinged mucus.   °· You have vaginal bleeding.   °· You have continuous abdominal pain.   °· You have low back pain that you never had before.   °· You feel your baby's head pushing down and causing pelvic pressure.   °· Your baby is not moving as much as it used to.   °  °This information is not intended to replace advice given to you by your health care provider. Make sure you discuss any questions you have with your health care provider. °  °Document Released: 06/07/2005 Document Revised: 06/12/2013 Document Reviewed: 03/19/2013 °Elsevier Interactive Patient Education ©2016 [employer] ° °

## 2015-08-04 ENCOUNTER — Other Ambulatory Visit: Payer: Self-pay | Admitting: Obstetrics and Gynecology

## 2015-08-05 ENCOUNTER — Inpatient Hospital Stay (HOSPITAL_COMMUNITY)
Admission: RE | Admit: 2015-08-05 | Discharge: 2015-08-06 | DRG: 774 | Disposition: A | Discharge status: Home / Self Care | Payer: BLUE CROSS/BLUE SHIELD | Source: Ambulatory Visit | Attending: Obstetrics and Gynecology | Admitting: Obstetrics and Gynecology

## 2015-08-05 ENCOUNTER — Encounter (HOSPITAL_COMMUNITY): Payer: Self-pay

## 2015-08-05 ENCOUNTER — Encounter (HOSPITAL_COMMUNITY): Payer: Self-pay | Admitting: Anesthesiology

## 2015-08-05 ENCOUNTER — Inpatient Hospital Stay (HOSPITAL_COMMUNITY): Payer: BLUE CROSS/BLUE SHIELD | Admitting: Anesthesiology

## 2015-08-05 DIAGNOSIS — Z8249 Family history of ischemic heart disease and other diseases of the circulatory system: Secondary | ICD-10-CM

## 2015-08-05 DIAGNOSIS — Z794 Long term (current) use of insulin: Secondary | ICD-10-CM | POA: Diagnosis not present

## 2015-08-05 DIAGNOSIS — E039 Hypothyroidism, unspecified: Secondary | ICD-10-CM | POA: Diagnosis present

## 2015-08-05 DIAGNOSIS — E119 Type 2 diabetes mellitus without complications: Secondary | ICD-10-CM | POA: Diagnosis present

## 2015-08-05 DIAGNOSIS — Z3490 Encounter for supervision of normal pregnancy, unspecified, unspecified trimester: Secondary | ICD-10-CM

## 2015-08-05 DIAGNOSIS — Z3A36 36 weeks gestation of pregnancy: Secondary | ICD-10-CM | POA: Diagnosis not present

## 2015-08-05 DIAGNOSIS — O2412 Pre-existing diabetes mellitus, type 2, in childbirth: Secondary | ICD-10-CM | POA: Diagnosis present

## 2015-08-05 DIAGNOSIS — O99824 Streptococcus B carrier state complicating childbirth: Secondary | ICD-10-CM | POA: Diagnosis present

## 2015-08-05 DIAGNOSIS — O99284 Endocrine, nutritional and metabolic diseases complicating childbirth: Secondary | ICD-10-CM | POA: Diagnosis present

## 2015-08-05 LAB — ABO/RH: ABO/RH(D): O POS

## 2015-08-05 LAB — GLUCOSE, CAPILLARY
GLUCOSE-CAPILLARY: 56 mg/dL — AB (ref 65–99)
GLUCOSE-CAPILLARY: 105 mg/dL — AB (ref 65–99)
GLUCOSE-CAPILLARY: 166 mg/dL — AB (ref 65–99)

## 2015-08-05 LAB — CBC
HCT: 30.9 % — ABNORMAL LOW (ref 36.0–46.0)
HEMOGLOBIN: 10.3 g/dL — AB (ref 12.0–15.0)
MCH: 28.3 pg (ref 26.0–34.0)
MCHC: 33.3 g/dL (ref 30.0–36.0)
MCV: 84.9 fL (ref 78.0–100.0)
PLATELETS: 234 10*3/uL (ref 150–400)
RBC: 3.64 MIL/uL — AB (ref 3.87–5.11)
RDW: 13.2 % (ref 11.5–15.5)
WBC: 9.6 10*3/uL (ref 4.0–10.5)

## 2015-08-05 LAB — RPR: RPR: NONREACTIVE

## 2015-08-05 LAB — TYPE AND SCREEN
ABO/RH(D): O POS
Antibody Screen: NEGATIVE

## 2015-08-05 MED ORDER — LACTATED RINGERS IV SOLN: 500.0000 mL | INTRAVENOUS | Status: DC | PRN

## 2015-08-05 MED ORDER — EPHEDRINE 5 MG/ML INJ
10.0000 mg | INTRAVENOUS | Status: DC | PRN
Start: 2015-08-05 — End: 2015-08-05
  Filled 2015-08-05: qty 2

## 2015-08-05 MED ORDER — DIBUCAINE 1 % RE OINT: 1.0000 "application " | TOPICAL_OINTMENT | RECTAL | Status: DC | PRN

## 2015-08-05 MED ORDER — OXYCODONE-ACETAMINOPHEN 5-325 MG PO TABS
1.0000 | ORAL_TABLET | ORAL | Status: DC | PRN
  Administered 2015-08-06: 1 via ORAL
  Filled 2015-08-05 (×2): qty 1

## 2015-08-05 MED ORDER — PENICILLIN G POTASSIUM 5000000 UNITS IJ SOLR
2.5000 10*6.[IU] | INTRAVENOUS | Status: DC
  Administered 2015-08-05: 2.5 10*6.[IU] via INTRAVENOUS
  Filled 2015-08-05 (×6): qty 2.5

## 2015-08-05 MED ORDER — SENNOSIDES-DOCUSATE SODIUM 8.6-50 MG PO TABS
2.0000 | ORAL_TABLET | ORAL | Status: DC
  Administered 2015-08-05: 2 via ORAL
  Filled 2015-08-05: qty 2

## 2015-08-05 MED ORDER — DEXTROSE 5 % IV SOLN
5.0000 10*6.[IU] | Freq: Once | INTRAVENOUS | Status: AC
  Administered 2015-08-05: 5 10*6.[IU] via INTRAVENOUS
  Filled 2015-08-05: qty 5

## 2015-08-05 MED ORDER — ONDANSETRON HCL 4 MG PO TABS: 4.0000 mg | ORAL_TABLET | ORAL | Status: DC | PRN

## 2015-08-05 MED ORDER — LACTATED RINGERS IV SOLN: 500.0000 mL | Freq: Once | INTRAVENOUS | Status: DC

## 2015-08-05 MED ORDER — LACTATED RINGERS IV SOLN
INTRAVENOUS | Status: DC
  Administered 2015-08-05 (×2): via INTRAVENOUS

## 2015-08-05 MED ORDER — INSULIN PUMP
Freq: Three times a day (TID) | SUBCUTANEOUS | Status: DC
  Administered 2015-08-05: 6.7 via SUBCUTANEOUS
  Administered 2015-08-06 (×2): via SUBCUTANEOUS
  Filled 2015-08-05: qty 1

## 2015-08-05 MED ORDER — MISOPROSTOL 25 MCG QUARTER TABLET
25.0000 ug | ORAL_TABLET | ORAL | Status: DC | PRN
Start: 2015-08-05 — End: 2015-08-05
  Administered 2015-08-05: 25 ug via VAGINAL
  Filled 2015-08-05: qty 1
  Filled 2015-08-05: qty 0.25

## 2015-08-05 MED ORDER — PHENYLEPHRINE 40 MCG/ML (10ML) SYRINGE FOR IV PUSH (FOR BLOOD PRESSURE SUPPORT)
80.0000 ug | PREFILLED_SYRINGE | INTRAVENOUS | Status: DC | PRN
  Filled 2015-08-05: qty 2
  Filled 2015-08-05: qty 20

## 2015-08-05 MED ORDER — MEASLES, MUMPS & RUBELLA VAC ~~LOC~~ INJ
0.5000 mL | INJECTION | Freq: Once | SUBCUTANEOUS | Status: DC
  Filled 2015-08-05: qty 0.5

## 2015-08-05 MED ORDER — LANOLIN HYDROUS EX OINT: TOPICAL_OINTMENT | CUTANEOUS | Status: DC | PRN

## 2015-08-05 MED ORDER — SIMETHICONE 80 MG PO CHEW: 80.0000 mg | CHEWABLE_TABLET | ORAL | Status: DC | PRN

## 2015-08-05 MED ORDER — OXYCODONE-ACETAMINOPHEN 5-325 MG PO TABS
2.0000 | ORAL_TABLET | ORAL | Status: DC | PRN
Start: 2015-08-05 — End: 2015-08-05

## 2015-08-05 MED ORDER — ONDANSETRON HCL 4 MG/2ML IJ SOLN
4.0000 mg | Freq: Four times a day (QID) | INTRAMUSCULAR | Status: DC | PRN
Start: 2015-08-05 — End: 2015-08-05

## 2015-08-05 MED ORDER — BISACODYL 10 MG RE SUPP: 10.0000 mg | Freq: Every day | RECTAL | Status: DC | PRN

## 2015-08-05 MED ORDER — ACETAMINOPHEN 325 MG PO TABS
650.0000 mg | ORAL_TABLET | ORAL | Status: DC | PRN
  Administered 2015-08-05: 650 mg via ORAL
  Filled 2015-08-05: qty 2

## 2015-08-05 MED ORDER — OXYTOCIN BOLUS FROM INFUSION: 500.0000 mL | INTRAVENOUS | Status: DC

## 2015-08-05 MED ORDER — LIDOCAINE HCL (PF) 1 % IJ SOLN
INTRAMUSCULAR | Status: DC | PRN
  Administered 2015-08-05: 6 mL via EPIDURAL
  Administered 2015-08-05: 4 mL

## 2015-08-05 MED ORDER — INSULIN PUMP
SUBCUTANEOUS | Status: DC
  Administered 2015-08-05: 1.8 via SUBCUTANEOUS
  Filled 2015-08-05: qty 1

## 2015-08-05 MED ORDER — TERBUTALINE SULFATE 1 MG/ML IJ SOLN
0.2500 mg | Freq: Once | INTRAMUSCULAR | Status: DC | PRN
  Filled 2015-08-05: qty 1

## 2015-08-05 MED ORDER — METHYLERGONOVINE MALEATE 0.2 MG/ML IJ SOLN
INTRAMUSCULAR | Status: AC
  Filled 2015-08-05: qty 1

## 2015-08-05 MED ORDER — METHYLERGONOVINE MALEATE 0.2 MG/ML IJ SOLN
0.2000 mg | Freq: Once | INTRAMUSCULAR | Status: AC
  Administered 2015-08-05: 0.2 mg via INTRAMUSCULAR

## 2015-08-05 MED ORDER — FLEET ENEMA 7-19 GM/118ML RE ENEM: 1.0000 | ENEMA | Freq: Every day | RECTAL | Status: DC | PRN

## 2015-08-05 MED ORDER — ACETAMINOPHEN 325 MG PO TABS: 650.0000 mg | ORAL_TABLET | ORAL | Status: DC | PRN

## 2015-08-05 MED ORDER — CITRIC ACID-SODIUM CITRATE 334-500 MG/5ML PO SOLN
30.0000 mL | ORAL | Status: DC | PRN
Start: 2015-08-05 — End: 2015-08-05

## 2015-08-05 MED ORDER — BENZOCAINE-MENTHOL 20-0.5 % EX AERO
1.0000 "application " | INHALATION_SPRAY | CUTANEOUS | Status: DC | PRN
  Administered 2015-08-05: 1 via TOPICAL
  Filled 2015-08-05: qty 56

## 2015-08-05 MED ORDER — OXYTOCIN 10 UNIT/ML IJ SOLN
1.0000 m[IU]/min | INTRAVENOUS | Status: DC
  Administered 2015-08-05: 666 m[IU]/min via INTRAVENOUS
  Administered 2015-08-05: 2 m[IU]/min via INTRAVENOUS
  Filled 2015-08-05: qty 4

## 2015-08-05 MED ORDER — DIPHENHYDRAMINE HCL 25 MG PO CAPS: 25.0000 mg | ORAL_CAPSULE | Freq: Four times a day (QID) | ORAL | Status: DC | PRN

## 2015-08-05 MED ORDER — LIDOCAINE HCL (PF) 1 % IJ SOLN
30.0000 mL | INTRAMUSCULAR | Status: DC | PRN
  Filled 2015-08-05: qty 30

## 2015-08-05 MED ORDER — MEDROXYPROGESTERONE ACETATE 150 MG/ML IM SUSP: 150.0000 mg | INTRAMUSCULAR | Status: DC | PRN

## 2015-08-05 MED ORDER — IBUPROFEN 600 MG PO TABS
600.0000 mg | ORAL_TABLET | Freq: Four times a day (QID) | ORAL | Status: DC
  Administered 2015-08-05 – 2015-08-06 (×4): 600 mg via ORAL
  Filled 2015-08-05 (×4): qty 1

## 2015-08-05 MED ORDER — FENTANYL 2.5 MCG/ML BUPIVACAINE 1/10 % EPIDURAL INFUSION (WH - ANES)
14.0000 mL/h | INTRAMUSCULAR | Status: DC | PRN
  Filled 2015-08-05: qty 125

## 2015-08-05 MED ORDER — OXYTOCIN 10 UNIT/ML IJ SOLN: 2.5000 [IU]/h | INTRAVENOUS | Status: DC

## 2015-08-05 MED ORDER — WITCH HAZEL-GLYCERIN EX PADS: 1.0000 "application " | MEDICATED_PAD | CUTANEOUS | Status: DC | PRN

## 2015-08-05 MED ORDER — ZOLPIDEM TARTRATE 5 MG PO TABS: 5.0000 mg | ORAL_TABLET | Freq: Every evening | ORAL | Status: DC | PRN

## 2015-08-05 MED ORDER — PNEUMOCOCCAL VAC POLYVALENT 25 MCG/0.5ML IJ INJ
0.5000 mL | INJECTION | INTRAMUSCULAR | Status: DC
  Filled 2015-08-05: qty 0.5

## 2015-08-05 MED ORDER — ONDANSETRON HCL 4 MG/2ML IJ SOLN: 4.0000 mg | INTRAMUSCULAR | Status: DC | PRN

## 2015-08-05 MED ORDER — TETANUS-DIPHTH-ACELL PERTUSSIS 5-2.5-18.5 LF-MCG/0.5 IM SUSP
0.5000 mL | Freq: Once | INTRAMUSCULAR | Status: AC
  Administered 2015-08-06: 0.5 mL via INTRAMUSCULAR
  Filled 2015-08-05: qty 0.5

## 2015-08-05 MED ORDER — PHENYLEPHRINE 40 MCG/ML (10ML) SYRINGE FOR IV PUSH (FOR BLOOD PRESSURE SUPPORT)
80.0000 ug | PREFILLED_SYRINGE | INTRAVENOUS | Status: DC | PRN
  Filled 2015-08-05: qty 2

## 2015-08-05 MED ORDER — DIPHENHYDRAMINE HCL 50 MG/ML IJ SOLN: 12.5000 mg | INTRAMUSCULAR | Status: DC | PRN

## 2015-08-05 MED ORDER — PRENATAL MULTIVITAMIN CH
1.0000 | ORAL_TABLET | Freq: Every day | ORAL | Status: DC
  Administered 2015-08-06: 1 via ORAL
  Filled 2015-08-05: qty 1

## 2015-08-05 MED ORDER — OXYCODONE-ACETAMINOPHEN 5-325 MG PO TABS: 1.0000 | ORAL_TABLET | ORAL | Status: DC | PRN

## 2015-08-05 NOTE — Anesthesia Preprocedure Evaluation (Signed)
Anesthesia Evaluation  Patient identified by MRN, date of birth, ID band Patient awake    Reviewed: Allergy & Precautions, H&P , Patient's Chart, lab work & pertinent test results  Airway Mallampati: II  TM Distance: >3 FB Neck ROM: full    Dental  (+) Teeth Intact   Pulmonary    breath sounds clear to auscultation       Cardiovascular hypertension,  Rhythm:regular Rate:Normal     Neuro/Psych    GI/Hepatic   Endo/Other  diabetes  Renal/GU      Musculoskeletal   Abdominal   Peds  Hematology   Anesthesia Other Findings       Reproductive/Obstetrics (+) Pregnancy                             Anesthesia Physical Anesthesia Plan  ASA: II  Anesthesia Plan: Epidural   Post-op Pain Management:    Induction:   Airway Management Planned:   Additional Equipment:   Intra-op Plan:   Post-operative Plan:   Informed Consent: I have reviewed the patients History and Physical, chart, labs and discussed the procedure including the risks, benefits and alternatives for the proposed anesthesia with the patient or authorized representative who has indicated his/her understanding and acceptance.   Dental Advisory Given  Plan Discussed with:   Anesthesia Plan Comments: (Labs checked- platelets confirmed with RN in room. Fetal heart tracing, per RN, reported to be stable enough for sitting procedure. Discussed epidural, and patient consents to the procedure:  included risk of possible headache,backache, failed block, allergic reaction, and nerve injury. This patient was asked if she had any questions or concerns before the procedure started.)        Anesthesia Quick Evaluation

## 2015-08-05 NOTE — H&P (Signed)
Stacey Bell is a 25 y.o. female presenting for IOL. Pregnancy complicated by IDDM on pump with good control. U/S about 5 days ago had EFW of 7# 6oz. Previous child with truncus arteriosis and normal fetal cardiac echo this pregnancy. Also hypothyroid. amnio yesterday mature. Maternal Medical History:  Fetal activity: Perceived fetal activity is normal.      OB History    Gravida Para Term Preterm AB TAB SAB Ectopic Multiple Living   5 3 0 Past Medical History  Diagnosis Date  . Celiac disease   . Pregnancy induced hypertension   . Diabetes mellitus     insulin  . Hypothyroidism    Past Surgical History  Procedure Laterality Date  . No past surgeries     Family History: family history includes Birth defects in her daughter; Cancer in her paternal uncle; Heart disease in her paternal grandfather; Hypertension in her maternal grandmother. Social History:  reports that she has never smoked. She has never used smokeless tobacco. She reports that she does not drink alcohol or use illicit drugs.   Prenatal Transfer Tool  Maternal Diabetes: Yes:  Diabetes Type:  Pre-pregnancy Genetic Screening: Normal Maternal Ultrasounds/Referrals: Normal Fetal Ultrasounds or other Referrals:  Fetal echo Maternal Substance Abuse:  No Significant Maternal Medications:  Meds include: Other: insulin, levothyroxine Significant Maternal Lab Results:  None Other Comments:  None  Review of Systems  Eyes: Negative for blurred vision.  Gastrointestinal: Negative for abdominal pain.  Neurological: Negative for headaches.    Dilation: 2 Effacement (%): 80 Exam by:: Mikeal Hawthorne, RN Blood pressure 137/72, pulse 74, temperature 98.3 F (36.8 C), temperature source Oral, resp. rate 18, height  (1.651 m), weight 165 lb (74.844 kg), last menstrual period 11/16/2014, unknown if currently breastfeeding. Maternal Exam:  Abdomen: Patient reports no abdominal tenderness.   Fetal Exam Fetal  State Assessment: Category I - tracings are normal.     Physical Exam  Cardiovascular: Normal rate and regular rhythm.   Respiratory: Effort normal and breath sounds normal.  GI: Soft. There is no tenderness.  Neurological: She has normal reflexes.    Prenatal labs: ABO, Rh: --/--/O POS, O POS (02/14 0120) Antibody: NEG (02/14 0120) Rubella: Immune (08/15 0000) RPR: Nonreactive (08/15 0000)  HBsAg:    HIV: Non-reactive (08/15 0000)  GBS: Positive (02/01 0000)   Assessment/Plan: 25 yo G5P3 at 36 4/7 weeks for IOL IDDM-insulin pump  Hypothyroid ATB for GBBS prophylaxis  Jalani Cullifer II,Hanna Aultman E 08/05/2015, 5:39 AM

## 2015-08-05 NOTE — Progress Notes (Signed)
Patient has an insulin pump that she is managing. Patient stated her current basal rate for her pump is as follows: 0000 is 1.25 units, 0300 1.3 units, 0700 is 1.4 units, 1030 1.45 units, 1200 is 1.4 units, 1900 1.6 units. Patient said she will change her rates this evening since she has now delivered her baby. She will let us know what her new rates will be.

## 2015-08-05 NOTE — Anesthesia Postprocedure Evaluation (Addendum)
Anesthesia Post Note  Patient: Stacey Bell  Procedure(s) Performed: * No procedures listed *  Patient location during evaluation: Mother Baby Anesthesia Type: Epidural Level of consciousness: awake Pain management: satisfactory to patient Vital Signs Assessment: post-procedure vital signs reviewed and stable Respiratory status: spontaneous breathing Cardiovascular status: stable Anesthetic complications: no Comments: Current pain 3, pain goal 8    Last Vitals:  Filed Vitals:   08/05/15 1415 08/05/15 1520  BP: 141/92 131/86  Pulse: 54 60  Temp: 36.6 C 36.6 C  Resp: 20 18    Last Pain:  Filed Vitals:   08/05/15 1552  PainSc: 5                  Baillie Mohammad

## 2015-08-05 NOTE — Progress Notes (Signed)
Pt checked blood sugar using her own machine.  142

## 2015-08-05 NOTE — Progress Notes (Signed)
Hypoglycemic Event  CBG: 56  Treatment: Gave Apple Juice 15G (Error - No data available.)@  Symptoms: None None  Follow-up CBG: Time:2045 CBG Result:105  Possible Reasons for Event: Patient did not re-do basal pump rate after delivery (Error - No data available.)@  Comments/MD notified:Grewal    Chas Axel L Zillah Alexie

## 2015-08-05 NOTE — Progress Notes (Signed)
1400 Epidural catheter removed. Tip intact

## 2015-08-05 NOTE — Anesthesia Procedure Notes (Signed)

## 2015-08-05 NOTE — Lactation Note (Signed)
This note was copied from a baby's chart. Lactation Consultation Note  Patient Name: Girl Estoria Geary ZOXWR'U Date: 08/05/2015 Reason for consult: Initial assessment Baby at 5 hr of life and mom reports bf is going well. She stated all 4 of her girls have been LPT infants. She pump to feed only with the oldest, the 2nd had latch trouble, the 3rd no issues bf for 12 months. She denies breast or nipple pain, voiced no concerns. Discussed baby behavior, LPT infant feeding guidelines, supplementing, baby belly size, voids, wt loss, breast changes, and nipple care. Mom stated that she can manually express but visitors were present. She does have a spoon at the bedside. Given lactation handouts. Aware of OP services and support.    Maternal Data Has patient been taught Hand Expression?: Yes Does the patient have breastfeeding experience prior to this delivery?: Yes  Feeding Feeding Type: Breast Fed Length of feed: 15 min  LATCH Score/Interventions                      Lactation Tools Discussed/Used WIC Program: No   Consult Status Consult Status: Follow-up Date: 08/06/15 Follow-up type: In-patient    Rulon Eisenmenger 08/05/2015, 5:25 PM

## 2015-08-05 NOTE — Progress Notes (Signed)
Patient has insulin pump that she is managing. Blood sugar checked; 56. Juice given. Patient self-adjusted insulin pump. Patient stated that her current basal rates after adjustment are: 0000 1 unit, 0300 1 unit, 0700 1.1 units, 1030 1.5 units, 1200 1 unit, 1900 1 unit.

## 2015-08-05 NOTE — Progress Notes (Signed)
Called Dr. Henderson Cloud to notify that patient was GBS positive according to the chart with no orders.

## 2015-08-06 ENCOUNTER — Encounter (HOSPITAL_COMMUNITY): Payer: Self-pay

## 2015-08-06 LAB — CBC
HEMATOCRIT: 35.2 % — AB (ref 36.0–46.0)
HEMOGLOBIN: 11.6 g/dL — AB (ref 12.0–15.0)
MCH: 27.4 pg (ref 26.0–34.0)
MCHC: 33 g/dL (ref 30.0–36.0)
MCV: 83.2 fL (ref 78.0–100.0)
Platelets: 235 10*3/uL (ref 150–400)
RBC: 4.23 MIL/uL (ref 3.87–5.11)
RDW: 13.1 % (ref 11.5–15.5)
WBC: 13.9 10*3/uL — ABNORMAL HIGH (ref 4.0–10.5)

## 2015-08-06 LAB — GLUCOSE, CAPILLARY
GLUCOSE-CAPILLARY: 52 mg/dL — AB (ref 65–99)
Glucose-Capillary: 93 mg/dL (ref 65–99)

## 2015-08-06 LAB — RUBELLA SCREEN: Rubella: 2.81 index (ref >0.99)

## 2015-08-06 MED ORDER — IBUPROFEN 600 MG PO TABS: 600.0000 mg | ORAL_TABLET | Freq: Four times a day (QID) | ORAL | Status: DC

## 2015-08-06 MED ORDER — OXYCODONE-ACETAMINOPHEN 5-325 MG PO TABS: 1.0000 | ORAL_TABLET | ORAL | Status: DC | PRN

## 2015-08-06 NOTE — Progress Notes (Signed)
Post Partum Day 1 Subjective: no complaints, up ad lib, voiding, tolerating PO and + flatus  Objective: Blood pressure 118/73, pulse 58, temperature 97.6 F (36.4 C), temperature source Oral, resp. rate 18, height  (1.651 m), weight 165 lb (74.844 kg), last menstrual period 11/16/2014, SpO2 97 %, unknown if currently breastfeeding.  Physical Exam:  General: alert and cooperative Lochia: appropriate Uterine Fundus: firm Incision: perineum intact DVT Evaluation: No evidence of DVT seen on physical exam. Negative Homan's sign. No cords or calf tenderness. No significant calf/ankle edema.   Recent Labs  08/05/15 0120 08/06/15 0619  HGB 10.3* 11.6*  HCT 30.9* 35.2*    Assessment/Plan: Discharge home   LOS: 1 day   Stacey Bell 08/06/2015, 8:37 AM

## 2015-08-06 NOTE — Discharge Summary (Signed)
Obstetric Discharge Summary Reason for Admission: induction of labor Prenatal Procedures: ultrasound Intrapartum Procedures: spontaneous vaginal delivery Postpartum Procedures: none Complications-Operative and Postpartum: none HEMOGLOBIN  Date Value Ref Range Status  08/06/2015 11.6* 12.0 - 15.0 g/dL Final   HCT  Date Value Ref Range Status  08/06/2015 35.2* 36.0 - 46.0 % Final    Physical Exam:  General: alert and cooperative Lochia: appropriate Uterine Fundus: firm Incision: perineum intact DVT Evaluation: No evidence of DVT seen on physical exam. Negative Homan's sign. No cords or calf tenderness. No significant calf/ankle edema.  Discharge Diagnoses: Term Pregnancy-delivered  Discharge Information: Date: 08/06/2015 Activity: pelvic rest Diet: routine Medications: PNV, Ibuprofen, Percocet and insulin pump and synthroid Condition: stable Instructions: refer to practice specific booklet Discharge to: home   Newborn Data: Live born female  Birth Weight: 7 lb 11.5 oz (3500 g) APGAR: 9, 9  Home with mother.  [name],[name] 08/06/2015, 8:45 AM

## 2015-08-06 NOTE — Lactation Note (Signed)
This note was copied from a baby's chart. Lactation Consultation Note; Experienced BF mom. Baby at [address]. Reports baby has been latching well with only slight pain with initial latch that eases off. Nipples intact. Baby asleep in bassinet. No questions at present. Reviewed our phone number to call with questions/concerns  Patient Name: Stacey Bell ZOXWR'U Date: 08/06/2015 Reason for consult: Follow-up assessment;Late preterm infant   Maternal Data Formula Feeding for Exclusion: No Does the patient have breastfeeding experience prior to this delivery?: Yes  Feeding    LATCH Score/Interventions                      Lactation Tools Discussed/Used     Consult Status Consult Status: Complete    Pamelia Hoit 08/06/2015, 12:00 PM

## 2016-11-04 ENCOUNTER — Encounter (HOSPITAL_BASED_OUTPATIENT_CLINIC_OR_DEPARTMENT_OTHER): Payer: Self-pay | Admitting: *Deleted

## 2016-11-06 NOTE — H&P (Signed)
26 year old female desires permanent sterilization.  Past Medical History:  Diagnosis Date  . Celiac disease   . History of gestational hypertension   . Hypothyroidism   . Insulin pump in place   . Type 1 diabetes mellitus on insulin therapy [facility](HCC)    ENDOCRINOLOGIST-  DR Aram BeechamYNTHIA BURNS [facility] )   Past Surgical History:  Procedure Laterality Date  . NO PAST SURGERIES     Prior to Admission medications   Medication Sig Start Date End Date Taking? Authorizing Provider  ibuprofen (ADVIL,MOTRIN) 600 MG tablet Take 1 tablet (600 mg total) by mouth every 6 (six) hours. 08/06/15   Julio Sicksurtis, Carol, NP  Insulin Human (INSULIN PUMP) SOLN Inject into the skin continuous. Uses Humalog. Basal rate:  2400 : 1.25 0300 : 1.30 0700 :  1.40 1030 : 1.45 1200 : 1.4 1900 : 1.6    [provider]  levothyroxine (SYNTHROID, LEVOTHROID) 112 MCG tablet Take 112 mcg by mouth daily before breakfast.    [provider]  oxyCODONE-acetaminophen (PERCOCET/ROXICET) 5-325 MG tablet Take 1-2 tablets by mouth every 4 (four) hours as needed for moderate pain. 08/06/15   Julio Sicksurtis, Carol, NP  Prenatal Vit-Fe Fumarate-FA (PRENATAL MULTIVITAMIN) TABS Take 1 tablet by mouth daily.     [provider]   Allergies Gluten  Family History  Problem Relation Age of Onset  . Birth defects Daughter        truncus arteriosus  . Cancer Paternal Uncle        skin  . Hypertension Maternal Grandmother   . Heart disease Paternal Grandfather    Social History   Social History  . Marital status: Married    Spouse name: N/A  . Number of children: N/A  . Years of education: N/A   Social History Main Topics  . Smoking status: Never Smoker  . Smokeless tobacco: Never Used  . Alcohol use No  . Drug use: No  . Sexual activity: Yes    Birth control/ protection: Pill   Other Topics Concern  . Not on file   Social History Narrative  . No narrative on file   There were no vitals taken for this  visit. General alert and oriented Lung CTAB Car RRR Abdomen is soft and non tender Pelvic WNL  Impression: Desires Permanent sterilization  PLAN: [facility] BTL Risks reviewed Consent signed

## 2016-11-08 ENCOUNTER — Encounter (HOSPITAL_BASED_OUTPATIENT_CLINIC_OR_DEPARTMENT_OTHER): Payer: Self-pay | Admitting: *Deleted

## 2016-11-08 NOTE — Progress Notes (Signed)
NPO AFTER MN.  ARRIVE AT 0600.  NEEDS EKG.  GETTING CBC AND BMET DONE THIS WEEK.  WILL TAKE SYNTHROID AM DOS W/ SIPS OF WATER.  PT HAS INSULIN PUMP.

## 2016-11-12 DIAGNOSIS — K900 Celiac disease: Secondary | ICD-10-CM | POA: Diagnosis not present

## 2016-11-12 DIAGNOSIS — Z794 Long term (current) use of insulin: Secondary | ICD-10-CM | POA: Diagnosis not present

## 2016-11-12 DIAGNOSIS — Z791 Long term (current) use of non-steroidal anti-inflammatories (NSAID): Secondary | ICD-10-CM | POA: Diagnosis not present

## 2016-11-12 DIAGNOSIS — Z9641 Presence of insulin pump (external) (internal): Secondary | ICD-10-CM | POA: Diagnosis not present

## 2016-11-12 DIAGNOSIS — E039 Hypothyroidism, unspecified: Secondary | ICD-10-CM | POA: Diagnosis not present

## 2016-11-12 DIAGNOSIS — Z79899 Other long term (current) drug therapy: Secondary | ICD-10-CM | POA: Diagnosis not present

## 2016-11-12 DIAGNOSIS — Z302 Encounter for sterilization: Secondary | ICD-10-CM | POA: Diagnosis present

## 2016-11-12 DIAGNOSIS — E109 Type 1 diabetes mellitus without complications: Secondary | ICD-10-CM | POA: Diagnosis not present

## 2016-11-12 LAB — CBC
HEMATOCRIT: 39.1 % (ref 36.0–46.0)
HEMOGLOBIN: 13.2 g/dL (ref 12.0–15.0)
MCH: 29.7 pg (ref 26.0–34.0)
MCHC: 33.8 g/dL (ref 30.0–36.0)
MCV: 87.9 fL (ref 78.0–100.0)
Platelets: 231 10*3/uL (ref 150–400)
RBC: 4.45 MIL/uL (ref 3.87–5.11)
RDW: 12.6 % (ref 11.5–15.5)
WBC: 5 10*3/uL (ref 4.0–10.5)

## 2016-11-12 LAB — BASIC METABOLIC PANEL
ANION GAP: 5 (ref 5–15)
BUN: 8 mg/dL (ref 6–20)
CO2: 30 mmol/L (ref 22–32)
Calcium: 8.8 mg/dL — ABNORMAL LOW (ref 8.9–10.3)
Chloride: 107 mmol/L (ref 101–111)
Creatinine, Ser: 0.67 mg/dL (ref 0.44–1.00)
GFR calc Af Amer: >60 mL/min (ref >60)
GLUCOSE: 40 mg/dL — AB (ref 65–99)
POTASSIUM: 3.3 mmol/L — AB (ref 3.5–5.1)
Sodium: 142 mmol/L (ref 135–145)

## 2016-11-16 ENCOUNTER — Encounter (HOSPITAL_BASED_OUTPATIENT_CLINIC_OR_DEPARTMENT_OTHER)
Admission: RE | Disposition: A | Discharge status: Home / Self Care | Payer: Self-pay | Source: Ambulatory Visit | Attending: Obstetrics and Gynecology

## 2016-11-16 ENCOUNTER — Ambulatory Visit (HOSPITAL_BASED_OUTPATIENT_CLINIC_OR_DEPARTMENT_OTHER)
Admission: RE | Admit: 2016-11-16 | Discharge: 2016-11-16 | Disposition: A | Discharge status: Home / Self Care | Payer: BLUE CROSS/BLUE SHIELD | Source: Ambulatory Visit | Attending: Obstetrics and Gynecology | Admitting: Obstetrics and Gynecology

## 2016-11-16 ENCOUNTER — Encounter (HOSPITAL_BASED_OUTPATIENT_CLINIC_OR_DEPARTMENT_OTHER): Payer: Self-pay | Admitting: *Deleted

## 2016-11-16 ENCOUNTER — Ambulatory Visit (HOSPITAL_BASED_OUTPATIENT_CLINIC_OR_DEPARTMENT_OTHER): Payer: BLUE CROSS/BLUE SHIELD | Admitting: Anesthesiology

## 2016-11-16 DIAGNOSIS — E109 Type 1 diabetes mellitus without complications: Secondary | ICD-10-CM | POA: Insufficient documentation

## 2016-11-16 DIAGNOSIS — K900 Celiac disease: Secondary | ICD-10-CM | POA: Diagnosis not present

## 2016-11-16 DIAGNOSIS — E039 Hypothyroidism, unspecified: Secondary | ICD-10-CM | POA: Diagnosis not present

## 2016-11-16 DIAGNOSIS — Z302 Encounter for sterilization: Secondary | ICD-10-CM | POA: Insufficient documentation

## 2016-11-16 DIAGNOSIS — Z79899 Other long term (current) drug therapy: Secondary | ICD-10-CM | POA: Insufficient documentation

## 2016-11-16 DIAGNOSIS — Z791 Long term (current) use of non-steroidal anti-inflammatories (NSAID): Secondary | ICD-10-CM | POA: Insufficient documentation

## 2016-11-16 DIAGNOSIS — Z794 Long term (current) use of insulin: Secondary | ICD-10-CM | POA: Insufficient documentation

## 2016-11-16 DIAGNOSIS — Z9641 Presence of insulin pump (external) (internal): Secondary | ICD-10-CM | POA: Insufficient documentation

## 2016-11-16 HISTORY — DX: Presence of insulin pump (external) (internal): Z96.41

## 2016-11-16 HISTORY — DX: Presence of spectacles and contact lenses: Z97.3

## 2016-11-16 HISTORY — PX: LAPAROSCOPIC TUBAL LIGATION: SHX1937

## 2016-11-16 HISTORY — DX: Personal history of other complications of pregnancy, childbirth and the puerperium: Z87.59

## 2016-11-16 HISTORY — DX: Type 1 diabetes mellitus without complications: E10.9

## 2016-11-16 LAB — POCT I-STAT, CHEM 8
BUN: 14 mg/dL (ref 6–20)
CALCIUM ION: 1.2 mmol/L (ref 1.15–1.40)
Chloride: 99 mmol/L — ABNORMAL LOW (ref 101–111)
Creatinine, Ser: 0.7 mg/dL (ref 0.44–1.00)
GLUCOSE: 224 mg/dL — AB (ref 65–99)
HCT: 40 % (ref 36.0–46.0)
Hemoglobin: 13.6 g/dL (ref 12.0–15.0)
Potassium: 4.2 mmol/L (ref 3.5–5.1)
SODIUM: 139 mmol/L (ref 135–145)
TCO2: 32 mmol/L (ref 0–100)

## 2016-11-16 LAB — POCT PREGNANCY, URINE: PREG TEST UR: NEGATIVE

## 2016-11-16 LAB — GLUCOSE, CAPILLARY: GLUCOSE-CAPILLARY: 194 mg/dL — AB (ref 65–99)

## 2016-11-16 SURGERY — LIGATION, FALLOPIAN TUBE, LAPAROSCOPIC
Anesthesia: General | Site: Abdomen | Laterality: Bilateral

## 2016-11-16 MED ORDER — MIDAZOLAM HCL 5 MG/5ML IJ SOLN
INTRAMUSCULAR | Status: DC | PRN
  Administered 2016-11-16: 2 mg via INTRAVENOUS

## 2016-11-16 MED ORDER — LIDOCAINE 2% (20 MG/ML) 5 ML SYRINGE
INTRAMUSCULAR | Status: AC
  Filled 2016-11-16: qty 5

## 2016-11-16 MED ORDER — PROPOFOL 10 MG/ML IV BOLUS
INTRAVENOUS | Status: DC | PRN
  Administered 2016-11-16: 180 mg via INTRAVENOUS

## 2016-11-16 MED ORDER — ONDANSETRON HCL 4 MG/2ML IJ SOLN
4.0000 mg | Freq: Four times a day (QID) | INTRAMUSCULAR | Status: DC | PRN
  Filled 2016-11-16: qty 2

## 2016-11-16 MED ORDER — LACTATED RINGERS IV SOLN
INTRAVENOUS | Status: DC
  Administered 2016-11-16 (×2): via INTRAVENOUS
  Filled 2016-11-16: qty 1000

## 2016-11-16 MED ORDER — PROPOFOL 10 MG/ML IV BOLUS
INTRAVENOUS | Status: AC
  Filled 2016-11-16: qty 20

## 2016-11-16 MED ORDER — DEXAMETHASONE SODIUM PHOSPHATE 4 MG/ML IJ SOLN
INTRAMUSCULAR | Status: DC | PRN
  Administered 2016-11-16: 10 mg via INTRAVENOUS

## 2016-11-16 MED ORDER — DEXAMETHASONE SODIUM PHOSPHATE 10 MG/ML IJ SOLN
INTRAMUSCULAR | Status: AC
  Filled 2016-11-16: qty 1

## 2016-11-16 MED ORDER — KETOROLAC TROMETHAMINE 30 MG/ML IJ SOLN
INTRAMUSCULAR | Status: DC | PRN
  Administered 2016-11-16: 30 mg via INTRAVENOUS

## 2016-11-16 MED ORDER — LIDOCAINE 2% (20 MG/ML) 5 ML SYRINGE
INTRAMUSCULAR | Status: DC | PRN
  Administered 2016-11-16: 60 mg via INTRAVENOUS

## 2016-11-16 MED ORDER — LACTATED RINGERS IV SOLN
INTRAVENOUS | Status: DC
  Filled 2016-11-16: qty 1000

## 2016-11-16 MED ORDER — FENTANYL CITRATE (PF) 100 MCG/2ML IJ SOLN
INTRAMUSCULAR | Status: AC
  Filled 2016-11-16: qty 2

## 2016-11-16 MED ORDER — DEXTROSE 5 % IV SOLN
2.0000 g | INTRAVENOUS | Status: AC
  Administered 2016-11-16: 2 g via INTRAVENOUS
  Filled 2016-11-16: qty 2

## 2016-11-16 MED ORDER — ONDANSETRON HCL 4 MG/2ML IJ SOLN
INTRAMUSCULAR | Status: AC
  Filled 2016-11-16: qty 2

## 2016-11-16 MED ORDER — FENTANYL CITRATE (PF) 100 MCG/2ML IJ SOLN
25.0000 ug | INTRAMUSCULAR | Status: DC | PRN
  Filled 2016-11-16: qty 1

## 2016-11-16 MED ORDER — KETOROLAC TROMETHAMINE 30 MG/ML IJ SOLN
INTRAMUSCULAR | Status: AC
  Filled 2016-11-16: qty 1

## 2016-11-16 MED ORDER — BUPIVACAINE HCL (PF) 0.25 % IJ SOLN
INTRAMUSCULAR | Status: DC | PRN
  Administered 2016-11-16: 6.5 mL

## 2016-11-16 MED ORDER — FENTANYL CITRATE (PF) 100 MCG/2ML IJ SOLN
INTRAMUSCULAR | Status: DC | PRN
  Administered 2016-11-16: 75 ug via INTRAVENOUS
  Administered 2016-11-16: 25 ug via INTRAVENOUS

## 2016-11-16 MED ORDER — CEFOTETAN DISODIUM-DEXTROSE 2-2.08 GM-% IV SOLR
INTRAVENOUS | Status: AC
  Filled 2016-11-16: qty 50

## 2016-11-16 MED ORDER — ROCURONIUM BROMIDE 50 MG/5ML IV SOSY
PREFILLED_SYRINGE | INTRAVENOUS | Status: AC
  Filled 2016-11-16: qty 5

## 2016-11-16 MED ORDER — OXYCODONE HCL 5 MG PO TABS
5.0000 mg | ORAL_TABLET | Freq: Once | ORAL | Status: DC | PRN
  Filled 2016-11-16: qty 1

## 2016-11-16 MED ORDER — MIDAZOLAM HCL 2 MG/2ML IJ SOLN
INTRAMUSCULAR | Status: AC
  Filled 2016-11-16: qty 2

## 2016-11-16 MED ORDER — OXYCODONE HCL 5 MG/5ML PO SOLN
5.0000 mg | Freq: Once | ORAL | Status: DC | PRN
Start: 2016-11-16 — End: 2016-11-16
  Filled 2016-11-16: qty 5

## 2016-11-16 MED ORDER — EPHEDRINE 5 MG/ML INJ
INTRAVENOUS | Status: AC
  Filled 2016-11-16: qty 10

## 2016-11-16 MED ORDER — ROCURONIUM BROMIDE 50 MG/5ML IV SOSY
PREFILLED_SYRINGE | INTRAVENOUS | Status: DC | PRN
  Administered 2016-11-16: 50 mg via INTRAVENOUS

## 2016-11-16 MED ORDER — ONDANSETRON HCL 4 MG/2ML IJ SOLN
INTRAMUSCULAR | Status: DC | PRN
Start: 2016-11-16 — End: 2016-11-16
  Administered 2016-11-16: 4 mg via INTRAVENOUS

## 2016-11-16 MED ORDER — SUGAMMADEX SODIUM 200 MG/2ML IV SOLN
INTRAVENOUS | Status: AC
  Filled 2016-11-16: qty 2

## 2016-11-16 MED ORDER — EPHEDRINE SULFATE-NACL 50-0.9 MG/10ML-% IV SOSY
PREFILLED_SYRINGE | INTRAVENOUS | Status: DC | PRN
  Administered 2016-11-16 (×2): 10 mg via INTRAVENOUS

## 2016-11-16 SURGICAL SUPPLY — 40 items
BAG URINE DRAINAGE (UROLOGICAL SUPPLIES) ×3 IMPLANT
BANDAGE ADHESIVE 1X3 (GAUZE/BANDAGES/DRESSINGS) IMPLANT
BLADE CLIPPER SURG (BLADE) IMPLANT
BLADE SURG 11 STRL SS (BLADE) ×3 IMPLANT
CABLE HIGH FREQUENCY MONO STRZ (ELECTRODE) IMPLANT
CANISTER SUCT 3000ML PPV (MISCELLANEOUS) IMPLANT
CLOSURE WOUND 1/4X4 (GAUZE/BANDAGES/DRESSINGS)
CLOTH BEACON ORANGE TIMEOUT ST (SAFETY) ×3 IMPLANT
COVER MAYO STAND STRL (DRAPES) IMPLANT
DERMABOND ADVANCED (GAUZE/BANDAGES/DRESSINGS) ×2
DERMABOND ADVANCED .7 DNX12 (GAUZE/BANDAGES/DRESSINGS) ×1 IMPLANT
DRSG TELFA 3X8 NADH (GAUZE/BANDAGES/DRESSINGS) IMPLANT
DURAPREP 26ML APPLICATOR (WOUND CARE) ×3 IMPLANT
ELECT REM PT RETURN 9FT ADLT (ELECTROSURGICAL) ×3
ELECTRODE REM PT RTRN 9FT ADLT (ELECTROSURGICAL) ×1 IMPLANT
GLOVE BIO SURGEON STRL SZ 6.5 (GLOVE) ×2 IMPLANT
GLOVE BIO SURGEONS STRL SZ 6.5 (GLOVE) ×1
GOWN STRL REUS W/TWL LRG LVL3 (GOWN DISPOSABLE) ×3 IMPLANT
KIT RM TURNOVER CYSTO AR (KITS) ×3 IMPLANT
NEEDLE HYPO 25X1 1.5 SAFETY (NEEDLE) ×3 IMPLANT
NEEDLE INSUFFLATION 14GA 120MM (NEEDLE) ×3 IMPLANT
NEEDLE INSUFFLATION 14GA 150MM (NEEDLE) IMPLANT
NS IRRIG 500ML POUR BTL (IV SOLUTION) ×3 IMPLANT
PACK LAPAROSCOPY BASIN (CUSTOM PROCEDURE TRAY) ×3 IMPLANT
PAD OB MATERNITY 4.3X12.25 (PERSONAL CARE ITEMS) ×3 IMPLANT
SCISSORS LAP 5X35 DISP (ENDOMECHANICALS) IMPLANT
SEALER TISSUE G2 CVD JAW 45CM (ENDOMECHANICALS) IMPLANT
SET IRRIG TUBING LAPAROSCOPIC (IRRIGATION / IRRIGATOR) IMPLANT
STRIP CLOSURE SKIN 1/4X4 (GAUZE/BANDAGES/DRESSINGS) IMPLANT
SUT MNCRL AB 3-0 PS2 18 (SUTURE) ×3 IMPLANT
SUT VIC AB 3-0 FS2 27 (SUTURE) IMPLANT
SUT VIC AB 3-0 PS2 18 (SUTURE)
SUT VIC AB 3-0 PS2 18XBRD (SUTURE) IMPLANT
SUT VICRYL 0 UR6 27IN ABS (SUTURE) ×3 IMPLANT
TOWEL OR 17X24 6PK STRL BLUE (TOWEL DISPOSABLE) ×6 IMPLANT
TRAY FOLEY CATH SILVER 14FR (SET/KITS/TRAYS/PACK) ×3 IMPLANT
TROCAR OPTI TIP 5M 100M (ENDOMECHANICALS) IMPLANT
TROCAR XCEL DIL TIP R 11M (ENDOMECHANICALS) ×3 IMPLANT
TUBING INSUF HEATED (TUBING) ×3 IMPLANT
WARMER LAPAROSCOPE (MISCELLANEOUS) ×3 IMPLANT

## 2016-11-16 NOTE — Anesthesia Procedure Notes (Signed)
Procedure Name: Intubation Date/Time: 11/16/2016 7:34 AM Performed by: Denna Haggard D Pre-anesthesia Checklist: Patient identified, Emergency Drugs available, Suction available and Patient being monitored Patient Re-evaluated:Patient Re-evaluated prior to inductionOxygen Delivery Method: Circle system utilized Preoxygenation: Pre-oxygenation with 100% oxygen Intubation Type: IV induction Ventilation: Mask ventilation without difficulty Laryngoscope Size: Mac and 3 Grade View: Grade I Tube type: Oral Tube size: 7.0 mm Number of attempts: 1 Airway Equipment and Method: Stylet and Oral airway Placement Confirmation: ETT inserted through vocal cords under direct vision,  positive ETCO2 and breath sounds checked- equal and bilateral Secured at: 22 cm Tube secured with: Tape Dental Injury: Teeth and Oropharynx as per pre-operative assessment

## 2016-11-16 NOTE — Anesthesia Postprocedure Evaluation (Signed)
Anesthesia Post Note  Patient: Stacey Bell  Procedure(s) Performed: Procedure(s) (LRB): LAPAROSCOPIC TUBAL LIGATION (Bilateral)  Patient location during evaluation: PACU Anesthesia Type: General Level of consciousness: awake and alert and patient cooperative Pain management: pain level controlled Vital Signs Assessment: post-procedure vital signs reviewed and stable Respiratory status: spontaneous breathing and respiratory function stable Cardiovascular status: stable Anesthetic complications: no       Last Vitals:  Vitals:   11/16/16 0845 11/16/16 0900  BP: 108/66 108/64  Pulse: 79 85  Resp: 17 17  Temp:      Last Pain:  Vitals:   11/16/16 0845  TempSrc:   PainSc: 2                  Estephan Gallardo S

## 2016-11-16 NOTE — Discharge Instructions (Signed)
Laparoscopic Tubal Ligation, Care After Refer to this sheet in the next few weeks. These instructions provide you with information about caring for yourself after your procedure. Your health care provider may also give you more specific instructions. Your treatment has been planned according to current medical practices, but problems sometimes occur. Call your health care provider if you have any problems or questions after your procedure. What can I expect after the procedure? After the procedure, it is common to have:  A sore throat.  Discomfort in your shoulder.  Mild discomfort or cramping in your abdomen.  Gas pains.  Pain or soreness in the area where the surgical cut (incision) was made.  A bloated feeling.  Tiredness.  Nausea.  Vomiting. Follow these instructions at home: Medicines   Take over-the-counter and prescription medicines only as told by your health care provider.  Do not take aspirin because it can cause bleeding.  Do not drive or operate heavy machinery while taking prescription pain medicine. Activity   Rest for the rest of the day.  Return to your normal activities as told by your health care provider. Ask your health care provider what activities are safe for you. Incision care    Follow instructions from your health care provider about how to take care of your incision. Make sure you:  Wash your hands with soap and water before you change your bandage (dressing). If soap and water are not available, use hand sanitizer.  Change your dressing as told by your health care provider.  Leave stitches (sutures) in place. They may need to stay in place for 2 weeks or longer.  Check your incision area every day for signs of infection. Check for:  More redness, swelling, or pain.  More fluid or blood.  Warmth.  Pus or a bad smell. Other Instructions   Do not take baths, swim, or use a hot tub until your health care provider approves. You may take  showers.  Keep all follow-up visits as told by your health care provider. This is important.  Have someone help you with your daily household tasks for the first few days. Contact a health care provider if:  You have more redness, swelling, or pain around your incision.  Your incision feels warm to the touch.  You have pus or a bad smell coming from your incision.  The edges of your incision break open after the sutures have been removed.  Your pain does not improve after 2-3 days.  You have a rash.  You repeatedly become dizzy or light-headed.  Your pain medicine is not helping.  You are constipated. Get help right away if:  You have a fever.  You faint.  You have increasing pain in your abdomen.  You have severe pain in one or both of your shoulders.  You have fluid or blood coming from your sutures or from your vagina.  You have shortness of breath or difficulty breathing.  You have chest pain or leg pain.  You have ongoing nausea, vomiting, or diarrhea. This information is not intended to replace advice given to you by your health care provider. Make sure you discuss any questions you have with your health care provider. Document Released: 12/25/2004 Document Revised: 11/10/2015 Document Reviewed: 05/18/2015 Elsevier Interactive Patient Education  2017 [place] Anesthesia Home Care Instructions  Activity: Get plenty of rest for the remainder of the day. A responsible individual must stay with you for 24 hours following the procedure.  For the next 24 hours, DO NOT: -Drive a car -Advertising copywriterperate machinery -Drink alcoholic beverages -Take any medication unless instructed by your physician -Make any legal decisions or sign important papers.  Meals: Start with liquid foods such as gelatin or soup. Progress to regular foods as tolerated. Avoid greasy, spicy, heavy foods. If nausea and/or vomiting occur, drink only clear liquids until the nausea and/or  vomiting subsides. Call your physician if vomiting continues.  Special Instructions/Symptoms: Your throat may feel dry or sore from the anesthesia or the breathing tube placed in your throat during surgery. If this causes discomfort, gargle with warm salt water. The discomfort should disappear within 24 hours.  If you had a scopolamine patch placed behind your ear for the management of post- operative nausea and/or vomiting:  1. The medication in the patch is effective for 72 hours, after which it should be removed.  Wrap patch in a tissue and discard in the trash. Wash hands thoroughly with soap and water. 2. You may remove the patch earlier than 72 hours if you experience unpleasant side effects which may include dry mouth, dizziness or visual disturbances. 3. Avoid touching the patch. Wash your hands with soap and water after contact with the patch.

## 2016-11-16 NOTE — Anesthesia Preprocedure Evaluation (Signed)
Anesthesia Evaluation  Patient identified by MRN, date of birth, ID band Patient awake    Reviewed: Allergy & Precautions, H&P , NPO status , Patient's Chart, lab work & pertinent test results  Airway Mallampati: II   Neck ROM: full    Dental   Pulmonary neg pulmonary ROS,    breath sounds clear to auscultation       Cardiovascular negative cardio ROS   Rhythm:regular Rate:Normal     Neuro/Psych    GI/Hepatic   Endo/Other  diabetes, Type 1, Insulin DependentHypothyroidism   Renal/GU      Musculoskeletal   Abdominal   Peds  Hematology   Anesthesia Other Findings   Reproductive/Obstetrics                             Anesthesia Physical Anesthesia Plan  ASA: II  Anesthesia Plan: General   Post-op Pain Management:    Induction: Intravenous  Airway Management Planned: Oral ETT  Additional Equipment:   Intra-op Plan:   Post-operative Plan: Extubation in OR  Informed Consent: I have reviewed the patients History and Physical, chart, labs and discussed the procedure including the risks, benefits and alternatives for the proposed anesthesia with the patient or authorized representative who has indicated his/her understanding and acceptance.     Plan Discussed with: CRNA, Anesthesiologist and Surgeon  Anesthesia Plan Comments:         Anesthesia Quick Evaluation

## 2016-11-16 NOTE — Progress Notes (Signed)
H and p on the chart No changes [facility] Consent signed

## 2016-11-16 NOTE — Transfer of Care (Signed)
Immediate Anesthesia Transfer of Care Note  Patient: Stacey Bell  Procedure(s) Performed: Procedure(s) (LRB): LAPAROSCOPIC TUBAL LIGATION (Bilateral)  Patient Location: PACU  Anesthesia Type: General  Level of Consciousness: awake, oriented, sedated and patient cooperative  Airway & Oxygen Therapy: Patient Spontanous Breathing and Patient connected to face mask oxygen  Post-op Assessment: Report given to PACU RN and Post -op Vital signs reviewed and stable  Post vital signs: Reviewed and stable  Complications: No apparent anesthesia complications Last Vitals:  Vitals:   11/16/16 0608 11/16/16 0814  BP: 111/80 121/88  Pulse: 70 95  Resp: 16 17  Temp: 36.4 C 36.8 C    Last Pain:  Vitals:   11/16/16 0814  TempSrc:   PainSc: 2

## 2016-11-16 NOTE — Op Note (Signed)
NAME:  Stacey Bell, [name]                  ACCOUNT NO.:  MEDICAL RECORD NO.:  [id number]  LOCATION:                                 FACILITY:  PHYSICIAN:  Bradford Cazier L. Vincente PoliGrewal, M.D.    DATE OF BIRTH:  DATE OF PROCEDURE:  11/16/2016 DATE OF DISCHARGE:                              OPERATIVE REPORT   PREOPERATIVE DIAGNOSIS:  Desires permanent sterilization.  POSTOPERATIVE DIAGNOSIS:  Desires permanent sterilization.  PROCEDURE:  Laparoscopic bilateral tubal ligation.  SURGEON:  Indira Sorenson L. Vincente PoliGrewal, M.D.  ANESTHESIA:  General with local.  DESCRIPTION OF PROCEDURE:  The patient was taken to the operating room after informed consent was obtained.  She was prepped and draped in the usual sterile fashion after she was intubated.  A uterine manipulator was inserted and a Foley catheter was inserted.  Attention was turned to the abdomen where a small incision was made at the umbilicus.  The Veress needle was inserted after local was infiltrated. Pneumoperitoneum was performed.  The Veress needle was removed and then the trocar was inserted one time.  The scope was introduced through the trocar sheath.  After introduction of the trocar sheath, the scope was used to inspect the pelvis.  The pelvis appeared to be normal.  The patient was placed in Trendelenburg position.  The uterus and ovaries appeared normal.  Using a long Kleppinger, we did bilateral tubal ligation with Kleppinger with the triple burn technique with the wattage down zero.  After the bilateral tubal ligation was performed, the scope was removed.  The pneumoperitoneum was released.  The trocar was removed.  The incision was then closed with 0 Vicryl deep stitch and then 3-0 Vicryl interrupted and then Dermabond.  All sponge, lap, and instrument counts were correct x2.  All instruments and the Foley catheter were removed.  The patient was extubated and went to recovery room in stable condition.     Erial Fikes L. Vincente PoliGrewal,  M.D.     Florestine AversMLG/MEDQ  D:  11/16/2016  T:  11/16/2016  Job:  [id number]

## 2016-11-16 NOTE — Brief Op Note (Signed)
11/16/2016  8:02 AM  PATIENT:  Stacey Bell  26 y.o. female  PRE-OPERATIVE DIAGNOSIS:  Desires permanent sterilization  POST-OPERATIVE DIAGNOSIS: Same  PROCEDURE:  Procedure(s): LAPAROSCOPIC TUBAL LIGATION (Bilateral)  SURGEON:  Surgeon(s) and Role:    * Marcelle OverlieGrewal, Shizuko Wojdyla, MD - Primary  PHYSICIAN ASSISTANT:   ASSISTANTS: none   ANESTHESIA:   local and general  EBL:  Total I/O In: 200 [I.V.:200] Out: 5 [Blood:5]  BLOOD ADMINISTERED:none  DRAINS: Urinary Catheter (Foley)   LOCAL MEDICATIONS USED:  LIDOCAINE   SPECIMEN:  No Specimen  DISPOSITION OF SPECIMEN:  N/A  COUNTS:  YES  TOURNIQUET:  * No tourniquets in log *  DICTATION: .Other Dictation: Dictation Number [id number]  PLAN OF CARE: Discharge to home after PACU  PATIENT DISPOSITION:  PACU - hemodynamically stable.   Delay start of Pharmacological VTE agent (>24hrs) due to surgical blood loss or risk of bleeding: not applicable

## 2016-11-17 ENCOUNTER — Encounter (HOSPITAL_BASED_OUTPATIENT_CLINIC_OR_DEPARTMENT_OTHER): Payer: Self-pay | Admitting: Obstetrics and Gynecology

## 2022-09-03 ENCOUNTER — Other Ambulatory Visit: Payer: Self-pay | Admitting: Plastic Surgery

## 2024-03-03 ENCOUNTER — Other Ambulatory Visit: Payer: Self-pay

## 2024-03-03 ENCOUNTER — Emergency Department (HOSPITAL_COMMUNITY)
Admission: EM | Admit: 2024-03-03 | Discharge: 2024-03-03 | Disposition: A | Discharge status: Home / Self Care | Attending: Emergency Medicine | Admitting: Emergency Medicine

## 2024-03-03 ENCOUNTER — Encounter (HOSPITAL_COMMUNITY): Payer: Self-pay | Admitting: Emergency Medicine

## 2024-03-03 DIAGNOSIS — Z794 Long term (current) use of insulin: Secondary | ICD-10-CM | POA: Insufficient documentation

## 2024-03-03 DIAGNOSIS — R55 Syncope and collapse: Secondary | ICD-10-CM | POA: Diagnosis not present

## 2024-03-03 DIAGNOSIS — R002 Palpitations: Secondary | ICD-10-CM | POA: Diagnosis present

## 2024-03-03 LAB — CBC WITH DIFFERENTIAL/PLATELET
Abs Immature Granulocytes: 0.03 K/uL (ref 0.00–0.07)
Basophils Absolute: 0.1 K/uL (ref 0.0–0.1)
Basophils Relative: 1 %
Eosinophils Absolute: 0.2 K/uL (ref 0.0–0.5)
Eosinophils Relative: 2 %
HCT: 38.3 % (ref 36.0–46.0)
Hemoglobin: 13 g/dL (ref 12.0–15.0)
Immature Granulocytes: 0 %
Lymphocytes Relative: 27 %
Lymphs Abs: 2.8 K/uL (ref 0.7–4.0)
MCH: 29.6 pg (ref 26.0–34.0)
MCHC: 33.9 g/dL (ref 30.0–36.0)
MCV: 87.2 fL (ref 80.0–100.0)
Monocytes Absolute: 0.8 K/uL (ref 0.1–1.0)
Monocytes Relative: 8 %
Neutro Abs: 6.6 K/uL (ref 1.7–7.7)
Neutrophils Relative %: 62 %
Platelets: 311 K/uL (ref 150–400)
RBC: 4.39 MIL/uL (ref 3.87–5.11)
RDW: 12.6 % (ref 11.5–15.5)
WBC: 10.5 K/uL (ref 4.0–10.5)
nRBC: 0 % (ref 0.0–0.2)

## 2024-03-03 LAB — BASIC METABOLIC PANEL WITH GFR
Anion gap: 9 (ref 5–15)
BUN: 15 mg/dL (ref 6–20)
CO2: 24 mmol/L (ref 22–32)
Calcium: 8.8 mg/dL — ABNORMAL LOW (ref 8.9–10.3)
Chloride: 102 mmol/L (ref 98–111)
Creatinine, Ser: 0.83 mg/dL (ref 0.44–1.00)
GFR, Estimated: >60 mL/min (ref >60)
Glucose, Bld: 226 mg/dL — ABNORMAL HIGH (ref 70–99)
Potassium: 4.2 mmol/L (ref 3.5–5.1)
Sodium: 135 mmol/L (ref 135–145)

## 2024-03-03 LAB — D-DIMER, QUANTITATIVE: D-Dimer, Quant: <0.27 ug{FEU}/mL (ref 0.00–0.50)

## 2024-03-03 LAB — TROPONIN I (HIGH SENSITIVITY): Troponin I (High Sensitivity): <2 ng/L (ref <18)

## 2024-03-03 NOTE — ED Triage Notes (Signed)
 Pt states she felt like her heart was beating hard and fast tonight with the first episode being around 1am  Pt c/o some chest pressure

## 2024-03-03 NOTE — ED Provider Notes (Signed)
 [place] EMERGENCY DEPARTMENT AT [facility] Provider Note   CSN: [id number] Arrival date & time: 03/03/24  9687     Patient presents with: Palpitations   Stacey Bell is a 33 y.o. female.   Presents to the emergency department with heart palpitations.  She reports that she had a difficult time going to sleep tonight because she was experiencing heart palpitations.  She did take her blood pressure and it was normal but her heart rate was in the 110s.  At 1 point she sat up on the edge of the bed to see if it would make her feel better and she became dizzy and felt like she nearly passed out.  No associated chest pain.       Prior to Admission medications   Medication Sig Start Date End Date Taking? Authorizing Provider  acetaminophen  (TYLENOL ) 500 MG tablet Take 500 mg by mouth every 6 (six) hours as needed.    [provider]  ibuprofen  (ADVIL ,MOTRIN ) 200 MG tablet Take 200 mg by mouth every 6 (six) hours as needed.    [provider]  Insulin  Human (INSULIN  PUMP) SOLN Inject into the skin continuous. Uses Humalog. Basal rate:  2400 : 1.0 0700 :  1.1 1030 : 1.15 1200 : 1.1 2400: Insulin  to Carb Ratio (1;--).6 Insulin  Sensitivity (1:--) 40 2400:  Target BG 120 0600:  Target BG 100    [provider]  levothyroxine  (SYNTHROID , LEVOTHROID) 112 MCG tablet Take 112 mcg by mouth daily before breakfast.    [provider]  Prenatal Vit-Fe Fumarate-FA (PRENATAL MULTIVITAMIN) TABS Take 1 tablet by mouth daily.     [provider]    Allergies: Sulfa antibiotics and Gluten    Review of Systems  Updated Vital Signs BP (!) 138/90 (BP Location: Left Arm)   Pulse 90   Temp 98.1 F (36.7 C) (Oral)   Resp (!) 21   Ht 5' 5.5 (1.664 m)   Wt 68 kg   SpO2 99%   BMI 24.58 kg/m   Physical Exam Vitals and nursing note reviewed.  Constitutional:      General: She is not in acute distress.    Appearance: She is  well-developed.  HENT:     Head: Normocephalic and atraumatic.     Mouth/Throat:     Mouth: Mucous membranes are moist.  Eyes:     General: Vision grossly intact. Gaze aligned appropriately.     Extraocular Movements: Extraocular movements intact.     Conjunctiva/sclera: Conjunctivae normal.  Cardiovascular:     Rate and Rhythm: Normal rate and regular rhythm.     Pulses: Normal pulses.     Heart sounds: Normal heart sounds, S1 normal and S2 normal. No murmur heard.    No friction rub. No gallop.  Pulmonary:     Effort: Pulmonary effort is normal. No respiratory distress.     Breath sounds: Normal breath sounds.  Abdominal:     General: Bowel sounds are normal.     Palpations: Abdomen is soft.     Tenderness: There is no abdominal tenderness. There is no guarding or rebound.     Hernia: No hernia is present.  Musculoskeletal:        General: No swelling.     Cervical back: Full passive range of motion without pain, normal range of motion and neck supple. No spinous process tenderness or muscular tenderness. Normal range of motion.     Right lower leg: No edema.  Left lower leg: No edema.  Skin:    General: Skin is warm and dry.     Capillary Refill: Capillary refill takes less than 2 seconds.     Findings: No ecchymosis, erythema, rash or wound.  Neurological:     General: No focal deficit present.     Mental Status: She is alert and oriented to person, place, and time.     GCS: GCS eye subscore is 4. GCS verbal subscore is 5. GCS motor subscore is 6.     Cranial Nerves: Cranial nerves 2-12 are intact.     Sensory: Sensation is intact.     Motor: Motor function is intact.     Coordination: Coordination is intact.  Psychiatric:        Attention and Perception: Attention normal.        Mood and Affect: Mood normal.        Speech: Speech normal.        Behavior: Behavior normal.     (all labs ordered are listed, but only abnormal results are displayed) Labs Reviewed   BASIC METABOLIC PANEL WITH GFR - Abnormal; Notable for the following components:      Result Value   Glucose, Bld 226 (*)    Calcium 8.8 (*)    All other components within normal limits  CBC WITH DIFFERENTIAL/PLATELET  D-DIMER, QUANTITATIVE  TROPONIN I (HIGH SENSITIVITY)    EKG: EKG Interpretation Date/Time:  Saturday March 03 2024 03:24:34 EDT Ventricular Rate:  88 PR Interval:  110 QRS Duration:  84 QT Interval:  358 QTC Calculation: 434 R Axis:   71  Text Interpretation: Sinus rhythm Borderline short PR interval Confirmed by Haze Lonni PARAS [phone]) on 03/03/2024 3:28:41 AM  Radiology: No results found.   Procedures   Medications Ordered in the ED - No data to display                                  Medical Decision Making Amount and/or Complexity of Data Reviewed Labs: ordered.   Presents to the emergency department for evaluation of heart palpitations and near syncope.  Patient first felt like her heart was racing and that caused her to have difficulty falling asleep.  After she was got up out of bed she felt dizzy and like she was going to pass out.  She has not had chest pain.  There is no shortness of breath.  Patient placed on the monitor.  EKG obtained.  EKG unremarkable, no arrhythmia noted on telemetry.  Lab work is unremarkable other than slight hyperglycemia.  She does have diabetes.  No evidence of DKA.  D-dimer is negative.  She is low risk for PE by Wells criteria.  No further workup necessary.       Final diagnoses:  Heart palpitations  Near syncope    ED Discharge Orders     None          Gissella Niblack, Lonni PARAS, MD 03/03/24 [phone]

## 2024-03-27 ENCOUNTER — Ambulatory Visit: Attending: Internal Medicine | Admitting: Internal Medicine

## 2024-03-27 ENCOUNTER — Encounter: Payer: Self-pay | Admitting: Internal Medicine

## 2024-03-27 ENCOUNTER — Ambulatory Visit

## 2024-03-27 VITALS — BP 118/76 | HR 84 | Ht 66.0 in | Wt 160.6 lb

## 2024-03-27 DIAGNOSIS — R002 Palpitations: Secondary | ICD-10-CM

## 2024-03-27 DIAGNOSIS — E039 Hypothyroidism, unspecified: Secondary | ICD-10-CM

## 2024-03-27 DIAGNOSIS — E109 Type 1 diabetes mellitus without complications: Secondary | ICD-10-CM

## 2024-03-27 DIAGNOSIS — R0789 Other chest pain: Secondary | ICD-10-CM | POA: Diagnosis not present

## 2024-03-27 NOTE — Progress Notes (Unsigned)
 Applied a 14 day Zio XT monitor to patient in the office ?

## 2024-03-27 NOTE — Progress Notes (Signed)
 Cardiology Office Note   Date:  03/27/2024  ID:  Stacey Bell, DOB July 10, 1990, MRN [id number] PCP: Donata Snowman, PA-C  [facility] Providers Cardiologist:  Emeline FORBES Calender, MD     History of Present Illness Stacey Bell is a 33 y.o. female with a history of type 1 diabetes with an insulin  pump, celiac disease, hypothyroidism who was recently in the ED on 03/03/2024 with chest pain and palpitations and lightheadedness prompting a referral by her PCP today.  Patient states today that back in April she had an episode of flushing with palpitations that occurred while at rest and a presyncopal episode at that time.  This lasted about 10 minutes or so.  This occurred again on 03/03/2024 prompting her to go to the ED.  She had 2 more episodes over the weekend.  All these episodes occur in a similar fashion.  Back in April she was found to have hypothyroidism with a TSH of 19.5 and has since improved to 10 and is being managed by her PCP.  More recently she has had chest discomfort and radiation to the back described as a burning.  She has checked her blood sugar during these episodes and they have been within the normal range.  Tobacco use: No Alcohol use: No Activity level: Was exercising prior to this but now exertion is causing her to have discomfort Pertinent family history:  Mother with recent diagnosis of SVT Daughter had truncus arteriosus and died as a preteen of complications post repair    ROS:  Review of Systems  All other systems reviewed and are negative.   Physical Exam  Physical Exam Vitals and nursing note reviewed.  Constitutional:      Appearance: Normal appearance.  HENT:     Head: Normocephalic and atraumatic.  Eyes:     Conjunctiva/sclera: Conjunctivae normal.  Neck:     Vascular: No carotid bruit.  Cardiovascular:     Rate and Rhythm: Normal rate and regular rhythm.  Pulmonary:     Effort: Pulmonary effort is normal.     Breath sounds:  Normal breath sounds.  Musculoskeletal:        General: No swelling or tenderness.  Skin:    Coloration: Skin is not jaundiced or pale.  Neurological:     Mental Status: She is alert.     VS:  BP 118/76   Pulse 84   Ht 5' 6 (1.676 m)   Wt 160 lb 9.6 oz (72.8 kg)   LMP 03/06/2024 (Exact Date)   SpO2 97%   BMI 25.92 kg/m         Wt Readings from Last 3 Encounters:  03/27/24 160 lb 9.6 oz (72.8 kg)  03/03/24 150 lb (68 kg)  11/16/16 143 lb (64.9 kg)     EKG Interpretation Date/Time:  Tuesday March 27 2024 09:18:16 EDT Ventricular Rate:  72 PR Interval:  132 QRS Duration:  78 QT Interval:  380 QTC Calculation: 416 R Axis:   63  Text Interpretation: Normal sinus rhythm T wave inversion in V1-V2, probably normal variant Normal ECG When compared with ECG of 03-Mar-2024 03:24, PR interval has increased Confirmed by Calender Emeline [phone]) on 03/27/2024 9:23:56 AM    Studies Reviewed        Risk Assessment/Calculations              ASSESSMENT  Palpitations of unclear etiology possibly undiagnosed arrhythmia in the setting of poorly controlled hypothyroidism.  Prior EKG with short PR interval (currently  improved), mother has a history of recently diagnosed SVT and daughter had truncus arteriosus. Presyncope related to #1 Chest discomfort of unclear etiology no CAD risk factors Hypothyroidism TSH 19.5 in April 2025-> 10.28 February 2024 Type 1 diabetes with insulin  pump   Plan  14-day event monitor Echocardiogram Can consider exercise stress testing depending on the above workup Continue to manage hypothyroidism  Follow up: 6 to 8 weeks post testing and improvement of thyroid function          Signed, Emeline FORBES Calender, MD

## 2024-03-27 NOTE — Patient Instructions (Signed)
 Medication Instructions:  Your physician recommends that you continue on your current medications as directed. Please refer to the Current Medication list given to you today.  *If you need a refill on your cardiac medications before your next appointment, please call your pharmacy*  Lab Work: None ordered If you have labs (blood work) drawn today and your tests are completely normal, you will receive your results only by: MyChart Message (if you have MyChart) OR A paper copy in the mail If you have any lab test that is abnormal or we need to change your treatment, we will call you to review the results.  Testing/Procedures: Your physician has requested that you have an echocardiogram. Echocardiography is a painless test that uses sound waves to create images of your heart. It provides your doctor with information about the size and shape of your heart and how well your heart's chambers and valves are working. This procedure takes approximately one hour. There are no restrictions for this procedure. Please do NOT wear cologne, perfume, aftershave, or lotions (deodorant is allowed). Please arrive 15 minutes prior to your appointment time.  Please note: We ask at that you not bring children with you during ultrasound (echo/ vascular) testing. Due to room size and safety concerns, children are not allowed in the ultrasound rooms during exams. Our front office staff cannot provide observation of children in our lobby area while testing is being conducted. An adult accompanying a patient to their appointment will only be allowed in the ultrasound room at the discretion of the ultrasound technician under special circumstances. We apologize for any inconvenience.      ZIO XT- Long Term Monitor Instructions  Your physician has requested you wear a ZIO patch monitor for 14 days.  This is a single patch monitor. [employer] supplies one patch monitor per enrollment. Additional stickers are not available.  Please do not apply patch if you will be having a Nuclear Stress Test,  Echocardiogram, Cardiac CT, MRI, or Chest Xray during the period you would be wearing the  monitor. The patch cannot be worn during these tests. You cannot remove and re-apply the  ZIO XT patch monitor.   Billing and Patient Assistance Program Information  We have supplied [employer] with any of your insurance information on file for billing purposes. [employer] offers a sliding scale Patient Assistance Program for patients that do not have  insurance, or whose insurance does not completely cover the cost of the ZIO monitor.  You must apply for the Patient Assistance Program to qualify for this discounted rate.  To apply, please call [employer] at [phone], select option 4, select option 2, ask to apply for  Patient Assistance Program. Meredeth will ask your household income, and how many people  are in your household. They will quote your out-of-pocket cost based on that information.  [employer] will also be able to set up a 23-month, interest-free payment plan if needed.  When you are ready to remove the patch, follow instructions on the last 2 pages of Patient  Logbook. Stick patch monitor onto the last page of Patient Logbook.  Place Patient Logbook in the blue and white box. Use locking tab on box and tape box closed  securely. The blue and white box has prepaid postage on it. Please place it in the mailbox as  soon as possible. Your physician should have your test results approximately 7 days after the  monitor has been mailed back to [facility].  Call [facility]  at [phone] if you have questions regarding  your ZIO XT patch monitor. Call them immediately if you see an orange light blinking on your  monitor.  If your monitor falls off in less than 4 days, contact our Monitor department at [phone].  If your monitor becomes loose or falls off after 4 days call [employer] at [phone] for   suggestions on securing your monitor  Follow-Up: At [facility], you and your health needs are our priority.  As part of our continuing mission to provide you with exceptional heart care, our providers are all part of one team.  This team includes your primary Cardiologist (physician) and Advanced Practice Providers or APPs (Physician Assistants and Nurse Practitioners) who all work together to provide you with the care you need, when you need it.  Your next appointment:   6-8 week(s)  Provider:   Emeline FORBES Calender, MD

## 2024-04-20 DIAGNOSIS — R002 Palpitations: Secondary | ICD-10-CM | POA: Diagnosis not present

## 2024-04-23 ENCOUNTER — Ambulatory Visit: Payer: Self-pay | Admitting: Internal Medicine

## 2024-05-04 ENCOUNTER — Ambulatory Visit (HOSPITAL_COMMUNITY)

## 2024-05-22 ENCOUNTER — Ambulatory Visit: Admitting: Internal Medicine

## 2024-06-11 ENCOUNTER — Ambulatory Visit (HOSPITAL_COMMUNITY)
Admission: RE | Admit: 2024-06-11 | Discharge: 2024-06-11 | Disposition: A | Discharge status: Home / Self Care | Source: Ambulatory Visit | Attending: Cardiovascular Disease | Admitting: Cardiovascular Disease

## 2024-06-11 DIAGNOSIS — R002 Palpitations: Secondary | ICD-10-CM | POA: Diagnosis present

## 2024-06-11 DIAGNOSIS — R079 Chest pain, unspecified: Secondary | ICD-10-CM

## 2024-06-11 LAB — ECHOCARDIOGRAM COMPLETE
Area-P 1/2: 3.91 cm2
S' Lateral: 2.9 cm

## 2024-06-18 ENCOUNTER — Encounter: Payer: Self-pay | Admitting: *Deleted

## 2024-06-20 ENCOUNTER — Ambulatory Visit: Admitting: Internal Medicine
